# Patient Record
Sex: Female | Born: 1975 | Race: White | Hispanic: No | Marital: Single | Smoking: Light tobacco smoker
Health system: Southern US, Community
[De-identification: ages and names within clinical notes are randomized; demographics above are authoritative.]

## PROBLEM LIST (undated history)

## (undated) ENCOUNTER — Inpatient Hospital Stay (HOSPITAL_COMMUNITY): Payer: Self-pay

## (undated) DIAGNOSIS — I10 Essential (primary) hypertension: Secondary | ICD-10-CM

## (undated) DIAGNOSIS — R51 Headache: Secondary | ICD-10-CM

## (undated) DIAGNOSIS — M51369 Other intervertebral disc degeneration, lumbar region without mention of lumbar back pain or lower extremity pain: Secondary | ICD-10-CM

## (undated) DIAGNOSIS — F419 Anxiety disorder, unspecified: Secondary | ICD-10-CM

## (undated) DIAGNOSIS — D649 Anemia, unspecified: Secondary | ICD-10-CM

## (undated) DIAGNOSIS — M5136 Other intervertebral disc degeneration, lumbar region: Secondary | ICD-10-CM

## (undated) DIAGNOSIS — M2550 Pain in unspecified joint: Secondary | ICD-10-CM

## (undated) DIAGNOSIS — M797 Fibromyalgia: Secondary | ICD-10-CM

## (undated) DIAGNOSIS — M549 Dorsalgia, unspecified: Secondary | ICD-10-CM

## (undated) HISTORY — PX: BACK SURGERY: SHX140

## (undated) HISTORY — PX: DILATION AND CURETTAGE OF UTERUS: SHX78

## (undated) HISTORY — PX: MULTIPLE TOOTH EXTRACTIONS: SHX2053

## (undated) HISTORY — PX: WISDOM TOOTH EXTRACTION: SHX21

---

## 1999-01-02 ENCOUNTER — Emergency Department (HOSPITAL_COMMUNITY): Admission: EM | Admit: 1999-01-02 | Discharge: 1999-01-02 | Payer: Self-pay | Admitting: Emergency Medicine

## 1999-02-08 ENCOUNTER — Ambulatory Visit (HOSPITAL_COMMUNITY): Admission: RE | Admit: 1999-02-08 | Discharge: 1999-02-08 | Payer: Self-pay | Admitting: *Deleted

## 1999-03-19 ENCOUNTER — Inpatient Hospital Stay (HOSPITAL_COMMUNITY): Admission: AD | Admit: 1999-03-19 | Discharge: 1999-03-19 | Payer: Self-pay | Admitting: *Deleted

## 1999-03-31 ENCOUNTER — Inpatient Hospital Stay (HOSPITAL_COMMUNITY): Admission: AD | Admit: 1999-03-31 | Discharge: 1999-03-31 | Payer: Self-pay | Admitting: *Deleted

## 1999-07-09 ENCOUNTER — Inpatient Hospital Stay (HOSPITAL_COMMUNITY): Admission: AD | Admit: 1999-07-09 | Discharge: 1999-07-12 | Payer: Self-pay | Admitting: Obstetrics

## 1999-07-09 ENCOUNTER — Encounter (INDEPENDENT_AMBULATORY_CARE_PROVIDER_SITE_OTHER): Payer: Self-pay

## 1999-09-10 ENCOUNTER — Encounter: Admission: RE | Admit: 1999-09-10 | Discharge: 1999-09-10 | Payer: Self-pay | Admitting: Pediatrics

## 1999-09-26 ENCOUNTER — Encounter: Payer: Self-pay | Admitting: Emergency Medicine

## 1999-09-26 ENCOUNTER — Emergency Department (HOSPITAL_COMMUNITY): Admission: EM | Admit: 1999-09-26 | Discharge: 1999-09-26 | Payer: Self-pay | Admitting: Emergency Medicine

## 2000-03-06 ENCOUNTER — Emergency Department (HOSPITAL_COMMUNITY): Admission: EM | Admit: 2000-03-06 | Discharge: 2000-03-06 | Payer: Self-pay | Admitting: Emergency Medicine

## 2000-03-06 ENCOUNTER — Encounter: Payer: Self-pay | Admitting: Emergency Medicine

## 2001-11-22 ENCOUNTER — Emergency Department (HOSPITAL_COMMUNITY): Admission: EM | Admit: 2001-11-22 | Discharge: 2001-11-22 | Payer: Self-pay | Admitting: Emergency Medicine

## 2001-11-22 ENCOUNTER — Encounter: Payer: Self-pay | Admitting: Emergency Medicine

## 2002-04-27 ENCOUNTER — Emergency Department (HOSPITAL_COMMUNITY): Admission: EM | Admit: 2002-04-27 | Discharge: 2002-04-28 | Payer: Self-pay | Admitting: Emergency Medicine

## 2002-04-27 ENCOUNTER — Encounter: Payer: Self-pay | Admitting: Emergency Medicine

## 2002-08-04 ENCOUNTER — Inpatient Hospital Stay (HOSPITAL_COMMUNITY): Admission: AD | Admit: 2002-08-04 | Discharge: 2002-08-04 | Payer: Self-pay | Admitting: *Deleted

## 2002-09-13 ENCOUNTER — Encounter: Payer: Self-pay | Admitting: *Deleted

## 2002-09-13 ENCOUNTER — Ambulatory Visit (HOSPITAL_COMMUNITY): Admission: RE | Admit: 2002-09-13 | Discharge: 2002-09-13 | Payer: Self-pay | Admitting: *Deleted

## 2003-02-16 ENCOUNTER — Encounter: Admission: RE | Admit: 2003-02-16 | Discharge: 2003-02-16 | Payer: Self-pay | Admitting: *Deleted

## 2003-02-16 ENCOUNTER — Inpatient Hospital Stay (HOSPITAL_COMMUNITY): Admission: AD | Admit: 2003-02-16 | Discharge: 2003-02-16 | Payer: Self-pay | Admitting: *Deleted

## 2003-02-20 ENCOUNTER — Inpatient Hospital Stay (HOSPITAL_COMMUNITY): Admission: AD | Admit: 2003-02-20 | Discharge: 2003-02-24 | Payer: Self-pay | Admitting: *Deleted

## 2003-04-24 ENCOUNTER — Emergency Department (HOSPITAL_COMMUNITY): Admission: AD | Admit: 2003-04-24 | Discharge: 2003-04-24 | Payer: Self-pay | Admitting: Family Medicine

## 2003-04-29 ENCOUNTER — Emergency Department (HOSPITAL_COMMUNITY): Admission: AD | Admit: 2003-04-29 | Discharge: 2003-04-29 | Payer: Self-pay | Admitting: Family Medicine

## 2003-09-18 ENCOUNTER — Emergency Department (HOSPITAL_COMMUNITY): Admission: EM | Admit: 2003-09-18 | Discharge: 2003-09-18 | Payer: Self-pay | Admitting: Family Medicine

## 2005-04-29 ENCOUNTER — Other Ambulatory Visit: Admission: RE | Admit: 2005-04-29 | Discharge: 2005-04-29 | Payer: Self-pay | Admitting: Obstetrics and Gynecology

## 2005-12-08 ENCOUNTER — Inpatient Hospital Stay (HOSPITAL_COMMUNITY): Admission: RE | Admit: 2005-12-08 | Discharge: 2005-12-11 | Payer: Self-pay | Admitting: Obstetrics and Gynecology

## 2006-04-29 ENCOUNTER — Emergency Department (HOSPITAL_COMMUNITY): Admission: EM | Admit: 2006-04-29 | Discharge: 2006-04-29 | Payer: Self-pay | Admitting: Emergency Medicine

## 2006-05-01 ENCOUNTER — Other Ambulatory Visit: Admission: RE | Admit: 2006-05-01 | Discharge: 2006-05-01 | Payer: Self-pay | Admitting: Obstetrics and Gynecology

## 2006-05-04 ENCOUNTER — Emergency Department (HOSPITAL_COMMUNITY): Admission: EM | Admit: 2006-05-04 | Discharge: 2006-05-04 | Payer: Self-pay | Admitting: Emergency Medicine

## 2006-05-27 ENCOUNTER — Emergency Department (HOSPITAL_COMMUNITY): Admission: EM | Admit: 2006-05-27 | Discharge: 2006-05-27 | Payer: Self-pay | Admitting: Emergency Medicine

## 2006-06-24 ENCOUNTER — Encounter: Admission: RE | Admit: 2006-06-24 | Discharge: 2006-06-24 | Payer: Self-pay | Admitting: Orthopaedic Surgery

## 2006-07-19 ENCOUNTER — Emergency Department (HOSPITAL_COMMUNITY): Admission: EM | Admit: 2006-07-19 | Discharge: 2006-07-19 | Payer: Self-pay | Admitting: Emergency Medicine

## 2006-07-24 ENCOUNTER — Encounter
Admission: RE | Admit: 2006-07-24 | Discharge: 2006-10-22 | Payer: Self-pay | Admitting: Physical Medicine & Rehabilitation

## 2006-07-27 ENCOUNTER — Ambulatory Visit: Payer: Self-pay | Admitting: Physical Medicine & Rehabilitation

## 2006-10-01 ENCOUNTER — Emergency Department (HOSPITAL_COMMUNITY): Admission: EM | Admit: 2006-10-01 | Discharge: 2006-10-01 | Payer: Self-pay | Admitting: Emergency Medicine

## 2006-11-29 ENCOUNTER — Emergency Department (HOSPITAL_COMMUNITY): Admission: EM | Admit: 2006-11-29 | Discharge: 2006-11-29 | Payer: Self-pay | Admitting: Emergency Medicine

## 2006-12-01 ENCOUNTER — Emergency Department (HOSPITAL_COMMUNITY): Admission: EM | Admit: 2006-12-01 | Discharge: 2006-12-01 | Payer: Self-pay | Admitting: Emergency Medicine

## 2007-04-23 ENCOUNTER — Emergency Department (HOSPITAL_COMMUNITY): Admission: EM | Admit: 2007-04-23 | Discharge: 2007-04-23 | Payer: Self-pay | Admitting: Family Medicine

## 2008-01-27 ENCOUNTER — Emergency Department (HOSPITAL_COMMUNITY): Admission: EM | Admit: 2008-01-27 | Discharge: 2008-01-27 | Payer: Self-pay | Admitting: Emergency Medicine

## 2008-01-30 ENCOUNTER — Emergency Department (HOSPITAL_COMMUNITY): Admission: EM | Admit: 2008-01-30 | Discharge: 2008-01-30 | Payer: Self-pay | Admitting: Emergency Medicine

## 2009-07-28 ENCOUNTER — Emergency Department (HOSPITAL_COMMUNITY): Admission: EM | Admit: 2009-07-28 | Discharge: 2009-07-28 | Payer: Self-pay | Admitting: Family Medicine

## 2009-10-15 ENCOUNTER — Emergency Department (HOSPITAL_COMMUNITY): Admission: EM | Admit: 2009-10-15 | Discharge: 2009-10-15 | Payer: Self-pay | Admitting: Emergency Medicine

## 2009-11-05 ENCOUNTER — Emergency Department (HOSPITAL_COMMUNITY): Admission: EM | Admit: 2009-11-05 | Discharge: 2009-11-05 | Payer: Self-pay | Admitting: Family Medicine

## 2010-05-28 ENCOUNTER — Emergency Department (HOSPITAL_COMMUNITY)
Admission: EM | Admit: 2010-05-28 | Discharge: 2010-05-28 | Payer: Self-pay | Source: Home / Self Care | Admitting: Emergency Medicine

## 2010-06-21 ENCOUNTER — Emergency Department (HOSPITAL_COMMUNITY)
Admission: EM | Admit: 2010-06-21 | Discharge: 2010-06-21 | Payer: Self-pay | Source: Home / Self Care | Admitting: Family Medicine

## 2010-09-06 ENCOUNTER — Inpatient Hospital Stay (INDEPENDENT_AMBULATORY_CARE_PROVIDER_SITE_OTHER)
Admission: RE | Admit: 2010-09-06 | Discharge: 2010-09-06 | Disposition: A | Discharge status: Home / Self Care | Payer: Medicaid Other | Source: Ambulatory Visit | Attending: Family Medicine | Admitting: Family Medicine

## 2010-09-06 DIAGNOSIS — J309 Allergic rhinitis, unspecified: Secondary | ICD-10-CM

## 2010-09-06 DIAGNOSIS — G44209 Tension-type headache, unspecified, not intractable: Secondary | ICD-10-CM

## 2010-10-11 NOTE — Procedures (Signed)
NAMEJONATHAN, Kimberly Pace             ACCOUNT NO.:  [id number]   MEDICAL RECORD NO.:  [id number]          PATIENT TYPE:  REC   LOCATION:  TPC                          FACILITY:  [facility]   PHYSICIAN:  Erick Colace, M.D.DATE OF BIRTH:  [birth year]   DATE OF PROCEDURE:  DATE OF DISCHARGE:                               OPERATIVE REPORT   REASON FOR VISIT:  Bilateral sacroiliac joint injection under  fluoroscopic guidance.   Informed consent was obtained after describing the risks and benefits of  the procedure to the patient. These including bruising, infection, loss  of bowel or bladder function, temporary or permanent paralysis. She  elected to proceed and has given written consent. The patient was placed  prone on the fluoroscopy table, Betadine prep, sterile drape. A 25-gauge  inch and a half needle was used to incise the skin and subcu tissue with  1% lidocaine x2 mL and then a 25-gauge 3-inch needle was inserted in the  left SI joint.  AP and lateral imaging and Omnipaque 180 x 0.5 mL  demonstrated no intravascular uptake and then 1 mL of a solution  containing 1 mL of 40 mg per mL Depo-Medrol and 2 mL of 2% lidocaine  injected.  The patient tolerated the procedure well and the right SI  joint was injected using the same equipment and same technique.  The  patient tolerated the procedure well. Pre and post injection vitals  stable.  Pre-injection pain level 10/10.  Post injection pain level  2/10.      Erick Colace, M.D.  Electronically Signed     AEK/MEDQ  D:  08/10/2006 15:03:01  T:  08/11/2006 08:17:26  Job:  [id number]   cc:   Sharolyn Douglas, M.D.  Fax: [phone]

## 2010-10-11 NOTE — Group Therapy Note (Signed)
INITIAL VISIT   Consult requested by Dr. Sharolyn Douglas in regards to back pain as well as  thigh pain.   HISTORY:  [age]-year-old female with a 4 year history of back  pain which she states originated from her first pregnancy.  She denies  any falls.  Looking back at her medical records, she actually had a  pregnancy in 2001 as well.  She has had subsequent pregnancy in 2007 and  she had some exacerbation of her pain since that time as well.  She has  had three C-sections with her pregnancies.  No reported complications  with that and looking through her E-chart, there does not appear to be  any surgical complications associated with her C-sections.  She has had  no motor vehicle accidents.  She has had ER visits in December 2007 for  low back pain treated with Lortab and prednisone.  She had another ER  visit in December for her low back lumbosacral pain.  Treated with  Flexeril and Lortab.  Referred to Orthopedics.  Seen in the ED again on  January 2 for low back pain.  Prescribed Flexeril and Vicodin.  Seen by  [facility], Primary MD.  Seen at [facility] June 08, 2006.  At that time she was on Zoloft,  Darvocet and clonazepam.  Had a prednisone Dosepak.  A lumbar spine in  the office demonstrated grade 2 anterolisthesis L5 on S1 with some  movement on flexion/extension.  She was also given Lortab 5/325 b.i.d.  #60.  She underwent an MRI of the lumbar spine June 24, 2006 showing  normal disks at L4-L5 and above.  At L5-S1 there was bilateral pars  intra-articularis defect, anterolisthesis measured 4 mm with  degenerative disk L5-S1 and foraminal narrowing.  She was seen back on  June 30, 2006 by Dr. Noel Gerold and diagnosed with isthmic  spondylolisthesis with foraminal narrowing and referred to Pain  Management for further treatment given that she had not had any  conservative care.  She had another ED visit on July 19, 2006 for  low  back pain.  She was prescribed Lortab #20.  This was approximately  two weeks after the time when she had received a Vicodin prescription  #30 from Dr. Noel Gerold.   Other past medical history unremarkable.  Pain is graded as 10/10,  fairly constant during the day, worse with activity as well as  inactivity.  Improves with heat.  She is able to climb steps sometimes.  She drives, takes care of her children, has some difficulties with  dressing, bathing and toileting.   REVIEW OF SYSTEMS:  Positive for trouble walking, depression, anxiety.  Negative for suicidal thoughts.   SOCIAL HISTORY:  Single with three daughters.  Has a significant other  that lives in a separate residence.  They are together.  She smokes a  half pack a day.  Occasional marijuana use and uses alcohol once or  twice a month.   FAMILY HISTORY:  Positive for alcohol abuse.   EXAMINATION:  Blood pressure 130/77, pulse 109, respirations 18, O2 sat  98% on room air.  She is an obese female in moderate distress shifting  from side to side and difficulty with transitional movements such as  getting on and off the table as well as getting up and down from a  supine position.  She did better after cuing for proper body techniques.   Her pin prick is reduced left  L4 and S1.  Otherwise intact.  Motor  strength is intact.  Hip range of motion, knee and ankle range of motion  is all normal.  Deep tendon reflexes are normal.  Her back has some  tenderness mainly at the PSIS left greater than right side.   IMPRESSION:  Primarily axial low back pain.  History of isthmic  spondylolisthesis with foraminal narrowing.  No clear radicular  component.  Her Pearlean Brownie test is positive PSIS and given onset during  pregnancy I question SI disorder.   PLAN:  1. Check urine drug screen today.  If using marijuana still will need      to retest GBS prior to prescribing any narcotic analgesics.  2. Schedule for SI injection and if this is not  particularly helpful      go on to trans-laminar epidural plus/minus facet injections.  3. Samples of Naprelan as well as Skelaxin given.   Thank you for this interesting consultation.  We will see the patient  back for injections.  May need to initiate some physical therapy.      Erick Colace, M.D.  Electronically Signed     AEK/MedQ  D:  07/27/2006 16:13:16  T:  07/27/2006 22:25:06  Job #:  [id number]   cc:   Fleet Contras, M.D.  Fax: [phone]

## 2010-10-11 NOTE — Discharge Summary (Signed)
NAMEJEM, Kimberly Pace             ACCOUNT NO.:  [id number]   MEDICAL RECORD NO.:  [id number]          PATIENT TYPE:  INP   LOCATION:  9120                          [facility]ACILITY:  [facility]   PHYSICIAN:  James A. Ashley Royalty, M.D.DATE OF BIRTH:  [birth year]   DATE OF ADMISSION:  12/08/2005  DATE OF DISCHARGE:  12/11/2005                                 DISCHARGE SUMMARY   DISCHARGE DIAGNOSES:  1. Intrauterine pregnancy at term, delivered.  2. Previous cesarean section.  3. Smoker.   OPERATIONS AND PROCEDURES:  Repeat low transverse cesarean section.   CONSULTATIONS:  None.   DISCHARGE MEDICATIONS:  Percocet, Motrin 600 mg, clonazepam 0.5 mg b.i.d.  p.r.n. (patient request).   HISTORY AND PHYSICAL:  This is a 29-year gravida 3, para 2 at 39 weeks 2  days gestation.  Prenatal care complicated by previous cesarean section x2  and obesity.  The patient had also has an anxiety disorder for which she  takes BuSpar 1/2 tablet b.i.d., 15 mg.  The patient was admitted for repeat  cesarean section.   For remainder of history and physical please see chart.   HOSPITAL COURSE:  The patient was admitted to [facility].  Admission laboratory studies were drawn.  On 12/08/2005 she was  taken to the operating room and underwent repeat low transverse cesarean  section.  Procedure was performed by Dr. Sylvester Harder and yielded 7 pounds  15 ounces female, Apgars 8 at one minute and 9 at five minutes, sent to  newborn nursery.  The patient's postoperative course was benign.  She was  discharged on the third postoperative day afebrile in satisfactory  condition.   DISPOSITION:  The patient is to return to [facility]  in approximately 6 weeks for follow-up.      James A. Ashley Royalty, M.D.  Electronically Signed     JAM/MEDQ  D:  01/28/2006  T:  01/29/2006  Job:  [id number]

## 2010-10-11 NOTE — Op Note (Signed)
NAME:  Kimberly Pace, Kimberly Pace                       ACCOUNT NO.:  [id number]   MEDICAL RECORD NO.:  [id number]                   PATIENT TYPE:  INP   LOCATION:  9125                                 [facility]ACILITY[facility]   PHYSICIAN:  Georgina Peer, M.D.              DATE OF BIRTH:  [birth year]   DATE OF PROCEDURE:  02/21/2003  DATE OF DISCHARGE:                                 OPERATIVE REPORT   PREOPERATIVE DIAGNOSES:  1. Intrauterine pregnancy 41 5/7 weeks.  2. Previous cesarean section.  3. Early labor.  4. Declined further attempt at vaginal birth after cesarean.   POSTOPERATIVE DIAGNOSES:  1. Intrauterine pregnancy 41 5/7 weeks.  2. Previous cesarean section.  3. Early labor.  4. Declined further attempt at vaginal birth after cesarean.  5. Macrosomia.   OPERATION PERFORMED:  Repeat low transverse cesarean section.   SURGEON:  Georgina Peer, M.D.   ANESTHESIA:  Epidural, Dr. Jean Rosenthal.   ESTIMATED BLOOD LOSS:  800 mL   COMPLICATIONS:  None.   FINDINGS:  10 pound, 13 ounce female born at 6:06 a.m., Apgar 9 & 9, thin  nonparticulate meconium stained fluid, tubes and ovaries normal.   INDICATIONS FOR PROCEDURE:  This is a 35 year old, gravida 2, para 1-0-0-0  with a previous cesarean section presented for induction. The patient at the  time of admission had an unfavorable cervix but did want to try vaginal  birth after cesarean understanding the risks and complications of VBAC . Her  cervix was 1 and long. She had a Cervidil placed approximately midnight on  the 27th and began contracting regularly after that with contractions every  2-3 minutes that were painful. The patient's cervix did not dilate over four  hours and she decided that further attempts at vaginal birth were not what  she desired and she elected to proceed with repeat cesarean section. Fetal  heart tones were reactive, membranes had not ruptured.   DESCRIPTION OF PROCEDURE:  The patient was taken to the  operating room,  fetal heart tones were auscultated and were normal at 140. She was given an  epidural by Dr. Jean Rosenthal. After adequate anesthesia, she was placed in the  dorsal supine left lateral uterine displacement position. Skin was tested  with an Allis. Appropriate time out and patient identification was taken. A  transverse skin incision was made through the previous incision. This was  taken into the peritoneal cavity in the normal Pfannenstiel manner with  minimal scar tissue being encountered. The perineal incision was widened and  a bladder blade placed over the bladder. A transverse bladder flap was  created and taken down off the lower uterine segment. The bladder was placed  over the bladder flap. A transverse uterine incision was made and at 6:06  through an incision widened with bandage scissors and ruptured membranes  which showed thin nonparticulate meconium, a female infant was delivered  with the aid  of fundal pressure. The infant cried spontaneously, mouth and  nose were suctioned and there was no respiratory distress. The cord was  doubly clamped and cut and the infant was handed to the pediatric team in  attendance. Cord blood was taken, Apgar were 9 & 9 and the weight was 10  pounds and 13 ounces. The placenta was removed as well as the membranes and  the incision was found to be without extension. It was closed in layer with  running locking Vicryl with figure-of-eight suture of Vicryl in the mid  portion to control the bleeder. The pelvis was irrigated and all debris was  removed, the incision was hemostatic. Tubes and ovaries were inspected and  found to be normal. The peritoneum and muscles were approximated using 3-0  Vicryl, the fascia was closed using Vicryl suture, the subcutaneous tissue  was closed using plain gut and the skin was closed using a subcuticular  Monocryl. The patient received 1 g of Ancef after the cord was clamped  during the case. Sponge,  needle and instrument counts were correct x2 and  the patient returned to the recovery area in stable condition.                                               Georgina Peer, M.D.    JPN/MEDQ  D:  02/21/2003  T:  02/21/2003  Job:  [id number]

## 2010-10-11 NOTE — Op Note (Signed)
   NAME:  Kimberly Pace, Kimberly Pace                       ACCOUNT NO.:  [id number]   MEDICAL RECORD NO.:  [id number]                   PATIENT TYPE:  OUT   LOCATION:  ULT                                  [facility]   PHYSICIAN:  Conni Elliot, M.D.             DATE OF BIRTH:  1975/12/14   DATE OF PROCEDURE:  DATE OF DISCHARGE:  09/13/2002                                 OPERATIVE REPORT   PREOPERATIVE DIAGNOSIS:  Intrauterine pregnancy at term with breech  presentation.   POSTOPERATIVE DIAGNOSIS:  Intrauterine pregnancy at term with breech  presentation.   PROCEDURE:  Low transverse cesarean section.   SURGEON:  Conni Elliot, M.D.   ASSISTANT:  Tomi Bamberger. Beverely Pace, M.D.   ANESTHESIA:  Spinal.   OPERATIVE FINDINGS:  Female infant in breech presentation.  Cord pH was 7.27.  Apgars were 1, 3 and 5 and 1, 5 and 10 minutes, respectively.  The fetus  weight 3059 grams.   DESCRIPTION OF PROCEDURE:  Under spinal anesthesia, the patient was in the  supine position.  Abdomen was prepped and draped in the sterile fashion.  A  low transverse Pfannenstiel incision was made.  Incision was extended to the  fascia.  Bladder flap created.  The baby was delivered from the breech  presentation without difficulty.  There was no entrapment of the fetal head.  The cord and doubly clamped and cut, and the placenta was delivered  spontaneously.  Uterus, bladder flap anteverted in the usual fashion.  Skin  closed in the usual fashion.  Estimated blood loss was approximately 900 mL.  Needle and instrument count correct.                                               Conni Elliot, M.D.    ASG/MEDQ  D:  09/20/2002  T:  09/21/2002  Job:  [id number]

## 2010-10-11 NOTE — Discharge Summary (Signed)
[facility]  Patient:    Kimberly Pace, Kimberly Pace                    MRN: [id number] Adm. Date:  [id number] Disc. Date: [id number] Attending:  Tammi Sou Dictator:   Raynelle Jan, M.D.                           Discharge Summary  ADMISSION DIAGNOSIS:          Onset of labor.  DISCHARGE DIAGNOSES:          1. Term pregnancy, delivered.                               2. Low transverse cesarean section.                               3. Breech presentation.  HISTORY OF PRESENT ILLNESS:   This is a 35 year old, gravida 1, para 0, at 40 weeks by last menstrual period who began having contractions at 6 a.m. the day prior o admission which was increasing in frequency and intensity.  She has no prior OB  history.  GYN HISTORY:                  None.  PAST MEDICAL HISTORY:         None.  PAST SURGICAL HISTORY:        None.  MEDICATIONS:                  1. Prenatal vitamins.                               2. Tylenol.  ALLERGIES:                    No known drug allergies.  PHYSICAL EXAMINATION:         VITAL SIGNS: Temperature 98.7, blood pressure 113/56, and pulse 86.  Fetal heart tones in the 120s with positive accellerations and no decellerations.  She is contracting every two to five minutes.  CHEST: Clear. HEART: Regular rate and rhythm with no murmur.  ABDOMEN: Gravid, nontender, with positive bowel sounds.  PELVIC: Cervix was 3, 80%, and -1.  Therefore she was admitted due to early labor.  HOSPITAL COURSE:              Shortly after admission the resident was called to the room by nursing secondary to questionable fetal presentation.  An ultrasound was performed and the child was found to be in breech presentation.  Therefore, she went to the operating room for cesarean section performed by Conni Elliot,  M.D. without maternal complications.  At 12:54 p.m. on February 13, she delivered a female infant with Apgars of 1 at  one minute, 3 at five minutes, and 5 at ten minutes.  The infant was immediately intubated and was transferred to the NICU where the child was found to have polycystic kidneys and long hypoplasia.  The infant died shortly after delivery.  The patients postpartum course was uneventful from a physical standpoint and on postpartum day #3 was felt to be stable for discharge.  At the time of discharge, her vital signs were stable, her examination was benign, the  uterus was firm, her incision was clean, dry, and intact and the staples were removed without complications.  Her H&H was 9.6 and 28.3.  DISCHARGE MEDICATIONS:        1. Ibuprofen 800 mg p.o. q.8h. p.r.n. pain.                               2. Tylox one tablet p.o. q.6h. p.r.n. pain.                               3. Ambien 10 mg p.o. q.h.s. p.r.n. sleeplessness.                               4. Iron 325 mg p.o. t.i.d.                               5. Prenatal vitamins one tablet p.o. q.d.                               6. Colace 100 mg p.o. b.i.d.  DISCHARGE ACTIVITY:           She is to continue to have pelvic rest for six weeks and have no heavy lifting for six weeks as well.  WOUND CARE:                   She is to keep her incision clean and dry.  FOLLOW-UP:                    She is to follow up at six weeks at [facility]. At that time, she desires IUD placement for contraception. DD:  07/24/99 TD:  07/24/99 Job: [id number]

## 2010-10-11 NOTE — Op Note (Signed)
[name], Kimberly Pace             ACCOUNT NO.:  [id number]   MEDICAL RECORD NO.:  [id number]          PATIENT TYPE:  INP   LOCATION:  9120                          FACILITY:  [facility]   PHYSICIAN:  James A. Ashley Royalty, M.D.DATE OF BIRTH:  06-25-1975   DATE OF PROCEDURE:  12/08/2005  DATE OF DISCHARGE:                                 OPERATIVE REPORT   PREOPERATIVE DIAGNOSES:  1.  Intrauterine pregnancy at [redacted] weeks gestation.  2.  Previous cesarean section.   POSTOPERATIVE DIAGNOSES:  1.  Intrauterine pregnancy at [redacted] weeks gestation.  2.  Previous cesarean section.   PROCEDURE:  Repeat low transverse cesarean section.   SURGEON:  Rudy Jew. Ashley Royalty, M.D.   ASSISTANT:  Bing Neighbors. Sydnee Cabal, M.D.   ANESTHESIA:  Spinal.   FINDINGS:  7 pound 15 ounce female, Apgars 8 at one minute, 9 at 5 minutes,  sent to newborn nursery.   ESTIMATED BLOOD LOSS:  600 cc.   COMPLICATIONS:  None.   PACKS AND DRAINS:  Foley.   SPONGE, NEEDLE, AND INSTRUMENT COUNTS:  Reported as correct x2.   PROCEDURE:  The patient was taken to the operating room and placed in the  sitting position.  After spinal anesthetic was administered, she was placed  in the dorsal supine position and prepped and draped in the usual manner for  abdominal surgery.  Foley catheter was placed.  A Pfannenstiel incision was  made through the patient's old C-section scar.  The subcutaneous tissues  sharply and bluntly dissected down to the fascia, which was nicked with a  knife and incised transversely with Mayo scissors.  The underlying rectus  muscles were separated from the fascia using sharp and blunt dissection.  The rectus muscles were separated in the midline, exposing the peritoneum,  which was elevated with hemostats and entered atraumatically with Metzenbaum  scissors.  Incision was extended longitudinally.  The uterus was identified,  and a bladder flap created by incising the intrauterine serosa and sharply  and  bluntly dissecting the bladder inferiorly.  It was held in place with a  bladder blade.  The uterus was then entered through a low transverse  incision using sharp and blunt dissection.  The fluid was clear.  It was  delivered from a vertex presentation in an atraumatic manner.  There was a  nuchal cord x1.  The infant was suctioned.  The cord was doubly clamped,  cut, and the infant given immediately to the awaiting pediatrics team.  Placenta and membranes were removed in their entirety.  The uterus was  exteriorized.  The uterus was then closed in two running layers of #1  Vicryl.  The first was a running-locking layer.  The second was a running,  intermittently locking, and imbricating layer.  Hemostasis was noted.  The  uterus, tubes, and ovaries were inspected and found to be otherwise normal.  They were returned to the abdominal cavity.  Copious irrigation was  accomplished.  Hemostasis was noted.  The peritoneum was then closed with 3-  0 Vicryl in a running fashion.  The fascia was closed with 0 Vicryl  in  running fashion.  The skin was closed with staples.   The patient tolerated the procedure extremely well and was returned to the  recovery room in good condition.  At the close of the procedure, the urine  was clear and copious.      James A. Ashley Royalty, M.D.  Electronically Signed     JAM/MEDQ  D:  12/08/2005  T:  12/08/2005  Job:  [id number]

## 2010-10-11 NOTE — Discharge Summary (Signed)
   NAME:  Kimberly Pace, Kimberly Pace                       ACCOUNT NO.:  [id number]   MEDICAL RECORD NO.:  [id number]                   PATIENT TYPE:  INP   LOCATION:  9125                                 [facility]ACILITY[facility]   PHYSICIAN:  Georgina Peer, M.D.              DATE OF BIRTH:  July 30, 1975   DATE OF ADMISSION:  02/20/2003  DATE OF DISCHARGE:  02/24/2003                                 DISCHARGE SUMMARY   ADMISSION DIAGNOSES:  Previous cesarean section 41-1/2 weeks.   DISCHARGE DIAGNOSES:  1. Previous cesarean section 41-1/2 weeks.  2. Unsuccessful trial of labor.  3. Macrosomia.   HISTORY OF PRESENT ILLNESS:  A 35 year old gravida 2, para 1, [facility] February 09, 2003 brought in for induction.  Began contracting.  Cervix was long and  closed.  The patient decided that she did not want to continue with vaginal  attempt.  Underwent a cesarean section at which time a 10 pound 13 ounce  female, Tacey Ruiz, was born at 61:06 on September 28.  She did well postpartum.  Passed gas on the first day.  Was ambulating, eating, and voiding.  Hemoglobin 10.9 from preoperative of 11.8.  She was afebrile.  Incision was  clean and dry with subcuticular sutures and she had no other complaints.  She was on Percocet and Motrin for pain and was discharged in good  condition.  Given a discharge booklet and will follow up in the office in  two weeks.                                               Georgina Peer, M.D.    JPN/MEDQ  D:  02/24/2003  T:  02/24/2003  Job:  [id number]

## 2010-10-11 NOTE — Assessment & Plan Note (Signed)
Ms. Bittinger returns today after last being seen on 08/10/2006 for  bilateral sacroiliac joint injection under fluoroscopic guidance.  She  went from a pre-injection pain level of 10/10 to a post-injection pain  level of 2/10 and this level relief lasted a couple of days.  She is  back to a 10/10 level.   She has had a prescription for Celebrex 200 b.i.d. since last visit as  well as Skelaxin 800 t.i.d.   Non-narcotic treatment was utilized secondary to having methadone  showing up in the urine drug screen without patient having reported  taking methadone and without her having a prescription for this.  In  addition, THC was found in the urine as well as oxazepam.  She was  taking clonazepam.  She also was found to have alprazolam, ie. Xanax,  which was not listed as a medication she had been taking.   Her current pain score is a 10/10.  Her pain diagram indicates low back  pain and bilateral lower extremity pain.   She states that the last injection while helping the first couple of  days, thereafter, she noted some increase in her pain to the point where  she had difficulty sleeping.  She has had no bowel or bladder  disturbance.  No weakness in the lower extremities.  No falls.  She can  walk 5 minutes at a time.  She climbs steps slowly.  She has no problems  with her ADLs, but indicates need for some assistance with her shopping,  meal prep and household duties.  No tingling in the lower extremities.  Depression and anxiety, but negative for suicidal thoughts.   SOCIAL HISTORY:  Single.  Lives alone.  Has 2 children.  Smokes a 1/2  pack a day.   PHYSICAL EXAMINATION:  She has full strength in the lower extremities.  She has pain over the PSIS bilaterally.  Gait is normal.  She has normal  lower extremity reflexes.   IMPRESSION:  Chronic low back and lower extremity pain.  She does have a  grade 2 anterolisthesis at L5 and S1.  She had a good result, although  temporary, with  a sacroiliac injection indicating this may be an  etiology of her pain and she does describe onset of her pain during  pregnancy.  However, as I indicated to the patient, one sacroiliac  injection is associated with 20% false positive improvement and to  increase diagnostic certainty, would need to repeat it with similar  improvement.  She states that she does not want any other injections  either of the sacroiliac joint or other parts of her spine.   PLAN:  Discussed treatment options stating that nonsteroidals plus  muscle relaxants would be treatment course as well as physical therapy.  She agreed to having some physical therapy, but did not want anymore  prescriptions for the Celebrex.  Did discuss the urine drug screen  results with her and cited this as the reason for non-narcotic  management in particular.   I will see her back in approximately 1 month.      Erick Colace, M.D.  Electronically Signed     [facility]  D:  08/31/2006 14:51:32  T:  08/31/2006 15:24:21  Job #:  [id number]   cc:   Sharolyn Douglas, M.D.  Fax: [phone]   Fleet Contras, M.D.  Fax: [phone]

## 2011-03-30 ENCOUNTER — Encounter: Payer: Self-pay | Admitting: *Deleted

## 2011-03-30 ENCOUNTER — Emergency Department (HOSPITAL_COMMUNITY)
Admission: EM | Admit: 2011-03-30 | Discharge: 2011-03-30 | Disposition: A | Discharge status: Home / Self Care | Payer: Self-pay | Attending: Emergency Medicine | Admitting: Emergency Medicine

## 2011-03-30 DIAGNOSIS — M25519 Pain in unspecified shoulder: Secondary | ICD-10-CM | POA: Insufficient documentation

## 2011-03-30 DIAGNOSIS — R6883 Chills (without fever): Secondary | ICD-10-CM | POA: Insufficient documentation

## 2011-03-30 DIAGNOSIS — X58XXXA Exposure to other specified factors, initial encounter: Secondary | ICD-10-CM | POA: Insufficient documentation

## 2011-03-30 DIAGNOSIS — IMO0002 Reserved for concepts with insufficient information to code with codable children: Secondary | ICD-10-CM | POA: Insufficient documentation

## 2011-03-30 MED ORDER — OXYCODONE-ACETAMINOPHEN 5-325 MG PO TABS: 2.0000 | ORAL_TABLET | ORAL | Status: AC | PRN

## 2011-03-30 MED ORDER — KETOROLAC TROMETHAMINE 60 MG/2ML IM SOLN
60.0000 mg | Freq: Once | INTRAMUSCULAR | Status: AC
  Administered 2011-03-30: 60 mg via INTRAMUSCULAR
  Filled 2011-03-30: qty 2
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
## 2011-03-30 NOTE — Progress Notes (Signed)
Orthopedic Tech Progress Note Patient Details:  Kimberly Pace 1976/04/27 [id number]  Other Ortho Devices Ortho Device Location: applied arm sling to (R) UE Ortho Device Interventions: Application   Jennye Moccasin 03/30/2011, 8:10 PM

## 2011-03-30 NOTE — ED Notes (Signed)
Assumed care on pt. Reports pain at right shoulder worse with movement /certain positions.

## 2011-03-30 NOTE — ED Provider Notes (Signed)
History     CSN: [id number] Arrival date & time: 03/30/2011  4:25 PM   First MD Initiated Contact with Patient 03/30/11 1812      Chief Complaint  Patient presents with  . Shoulder Injury    (Consider location/radiation/quality/duration/timing/severity/associated sxs/prior treatment) Patient is a 35 y.o. female presenting with shoulder injury.  Shoulder Injury   patient has chronic right shoulder pain. This demonstrated the mass to prevent that was 1-2 years ago. According to the patient states she has a history of a "torn ligaments." Her shoulder. No recent trauma, but states the pain was worse today. It is diffusely located in the right shoulder. She's had no fever. No chest pain. No dizziness no numbness no tingling. She's been taking naproxen with limited relief.  History reviewed. No pertinent past medical history.  Past Surgical History  Procedure Date  . Back surgery     No family history on file.  History  Substance Use Topics  . Smoking status: Not on file  . Smokeless tobacco: Not on file  . Alcohol Use: No    OB History    Grav Para Term Preterm Abortions TAB SAB Ect Mult Living                  Review of Systems  Constitutional: Positive for chills.  Respiratory: Negative for chest tightness.   All other systems reviewed and are negative.    Allergies  Review of patient's allergies indicates no known allergies.  Home Medications  No current outpatient prescriptions on file.  BP 133/99  Pulse 82  Temp 98.2 F (36.8 C)  Resp 20  SpO2 100%  LMP 03/23/2011  Physical Exam  Musculoskeletal:       No redness. No swelling. Mild diffuse tenderness. Limited range of motion. 2+ peripheral pulses    ED Course  Procedures (including critical care time)  Labs Reviewed - No data to display No results found.   No diagnosis found.    MDM  Pt is seen and examined;  Initial history and physical completed.  Will follow.          Yashas Camilli A.  Patrica Duel, MD 03/30/11 1820

## 2011-08-27 ENCOUNTER — Emergency Department (HOSPITAL_COMMUNITY): Payer: Medicaid Other

## 2011-08-27 ENCOUNTER — Emergency Department (HOSPITAL_COMMUNITY)
Admission: EM | Admit: 2011-08-27 | Discharge: 2011-08-28 | Disposition: A | Discharge status: Home / Self Care | Payer: Medicaid Other | Attending: Emergency Medicine | Admitting: Emergency Medicine

## 2011-08-27 ENCOUNTER — Encounter (HOSPITAL_COMMUNITY): Payer: Self-pay

## 2011-08-27 DIAGNOSIS — R109 Unspecified abdominal pain: Secondary | ICD-10-CM | POA: Insufficient documentation

## 2011-08-27 DIAGNOSIS — R350 Frequency of micturition: Secondary | ICD-10-CM | POA: Insufficient documentation

## 2011-08-27 DIAGNOSIS — N730 Acute parametritis and pelvic cellulitis: Secondary | ICD-10-CM | POA: Insufficient documentation

## 2011-08-27 DIAGNOSIS — Z79899 Other long term (current) drug therapy: Secondary | ICD-10-CM | POA: Insufficient documentation

## 2011-08-27 DIAGNOSIS — N760 Acute vaginitis: Secondary | ICD-10-CM | POA: Insufficient documentation

## 2011-08-27 DIAGNOSIS — R110 Nausea: Secondary | ICD-10-CM | POA: Insufficient documentation

## 2011-08-27 DIAGNOSIS — R3915 Urgency of urination: Secondary | ICD-10-CM | POA: Insufficient documentation

## 2011-08-27 DIAGNOSIS — N949 Unspecified condition associated with female genital organs and menstrual cycle: Secondary | ICD-10-CM | POA: Insufficient documentation

## 2011-08-27 DIAGNOSIS — Z9889 Other specified postprocedural states: Secondary | ICD-10-CM | POA: Insufficient documentation

## 2011-08-27 DIAGNOSIS — R10819 Abdominal tenderness, unspecified site: Secondary | ICD-10-CM | POA: Insufficient documentation

## 2011-08-27 DIAGNOSIS — B9689 Other specified bacterial agents as the cause of diseases classified elsewhere: Secondary | ICD-10-CM | POA: Insufficient documentation

## 2011-08-27 DIAGNOSIS — A499 Bacterial infection, unspecified: Secondary | ICD-10-CM | POA: Insufficient documentation

## 2011-08-27 LAB — WET PREP, GENITAL
Trich, Wet Prep: NONE SEEN
Yeast Wet Prep HPF POC: NONE SEEN

## 2011-08-27 LAB — CBC
HCT: 38.2 % (ref 36.0–46.0)
Hemoglobin: 12.8 g/dL (ref 12.0–15.0)
MCH: 27 pg (ref 26.0–34.0)
MCHC: 33.5 g/dL (ref 30.0–36.0)
MCV: 80.6 fL (ref 78.0–100.0)

## 2011-08-27 LAB — COMPREHENSIVE METABOLIC PANEL
AST: 12 U/L (ref 0–37)
Alkaline Phosphatase: 69 U/L (ref 39–117)
CO2: 25 mEq/L (ref 19–32)
Calcium: 9.7 mg/dL (ref 8.4–10.5)
GFR calc Af Amer: >90 mL/min (ref >90)
GFR calc non Af Amer: >90 mL/min (ref >90)
Glucose, Bld: 93 mg/dL (ref 70–99)
Total Bilirubin: 0.2 mg/dL — ABNORMAL LOW (ref 0.3–1.2)

## 2011-08-27 LAB — URINALYSIS, ROUTINE W REFLEX MICROSCOPIC
Leukocytes, UA: NEGATIVE
pH: 5.5 (ref 5.0–8.0)

## 2011-08-27 LAB — DIFFERENTIAL
Monocytes Absolute: 0.5 10*3/uL (ref 0.1–1.0)
Neutrophils Relative %: 65 % (ref 43–77)

## 2011-08-27 MED ORDER — HYDROCODONE-ACETAMINOPHEN 5-325 MG PO TABS: 2.0000 | ORAL_TABLET | ORAL | Status: AC | PRN

## 2011-08-27 MED ORDER — METRONIDAZOLE 500 MG PO TABS: 500.0000 mg | ORAL_TABLET | Freq: Two times a day (BID) | ORAL | Status: AC

## 2011-08-27 MED ORDER — DEXTROSE 5 % IV SOLN
1.0000 g | Freq: Once | INTRAVENOUS | Status: DC
  Filled 2011-08-27: qty 10

## 2011-08-27 MED ORDER — MORPHINE SULFATE 4 MG/ML IJ SOLN: 4.0000 mg | Freq: Once | INTRAMUSCULAR | Status: DC

## 2011-08-27 MED ORDER — HYDROCODONE-ACETAMINOPHEN 5-325 MG PO TABS
1.0000 | ORAL_TABLET | Freq: Once | ORAL | Status: AC
  Administered 2011-08-27: 1 via ORAL
  Filled 2011-08-27: qty 1

## 2011-08-27 MED ORDER — KETOROLAC TROMETHAMINE 30 MG/ML IJ SOLN
30.0000 mg | Freq: Once | INTRAMUSCULAR | Status: AC
  Administered 2011-08-27: 30 mg via INTRAVENOUS
  Filled 2011-08-27: qty 1

## 2011-08-27 MED ORDER — DOXYCYCLINE HYCLATE 100 MG PO CAPS: 100.0000 mg | ORAL_CAPSULE | Freq: Two times a day (BID) | ORAL | Status: AC

## 2011-08-27 MED ORDER — MORPHINE SULFATE 4 MG/ML IJ SOLN
4.0000 mg | Freq: Once | INTRAMUSCULAR | Status: AC
  Administered 2011-08-27: 4 mg via INTRAVENOUS
  Filled 2011-08-27: qty 1

## 2011-08-27 MED ORDER — CEFTRIAXONE SODIUM 250 MG IJ SOLR
250.0000 mg | Freq: Once | INTRAMUSCULAR | Status: AC
  Administered 2011-08-28: 250 mg via INTRAMUSCULAR
  Filled 2011-08-27: qty 250

## 2011-08-27 MED ORDER — ONDANSETRON HCL 4 MG PO TABS: 4.0000 mg | ORAL_TABLET | Freq: Four times a day (QID) | ORAL | Status: AC

## 2011-08-27 NOTE — ED Notes (Signed)
Pelvic cart set up in pt room 

## 2011-08-27 NOTE — ED Notes (Signed)
The patient states that she is "long overdue to have her IUD removed."

## 2011-08-27 NOTE — ED Notes (Signed)
Patient transported to Ultrasound 

## 2011-08-27 NOTE — Discharge Instructions (Signed)
Bacterial Vaginosis Bacterial vaginosis (BV) is a vaginal infection where the normal balance of bacteria in the vagina is disrupted. The normal balance is then replaced by an overgrowth of certain bacteria. There are several different kinds of bacteria that can cause BV. BV is the most common vaginal infection in women of childbearing age. CAUSES   The cause of BV is not fully understood. BV develops when there is an increase or imbalance of harmful bacteria.   Some activities or behaviors can upset the normal balance of bacteria in the vagina and put women at increased risk including:   Having a new sex partner or multiple sex partners.   Douching.   Using an intrauterine device (IUD) for contraception.   It is not clear what role sexual activity plays in the development of BV. However, women that have never had sexual intercourse are rarely infected with BV.  Women do not get BV from toilet seats, bedding, swimming pools or from touching objects around them.  SYMPTOMS   Grey vaginal discharge.   A fish-like odor with discharge, especially after sexual intercourse.   Itching or burning of the vagina and vulva.   Burning or pain with urination.   Some women have no signs or symptoms at all.  DIAGNOSIS  Your caregiver must examine the vagina for signs of BV. Your caregiver will perform lab tests and look at the sample of vaginal fluid through a microscope. They will look for bacteria and abnormal cells (clue cells), a pH test higher than 4.5, and a positive amine test all associated with BV.  RISKS AND COMPLICATIONS   Pelvic inflammatory disease (PID).   Infections following gynecology surgery.   Developing HIV.   Developing herpes virus.  TREATMENT  Sometimes BV will clear up without treatment. However, all women with symptoms of BV should be treated to avoid complications, especially if gynecology surgery is planned. Female partners generally do not need to be treated. However,  BV may spread between female sex partners so treatment is helpful in preventing a recurrence of BV.   BV may be treated with antibiotics. The antibiotics come in either pill or vaginal cream forms. Either can be used with nonpregnant or pregnant women, but the recommended dosages differ. These antibiotics are not harmful to the baby.   BV can recur after treatment. If this happens, a second round of antibiotics will often be prescribed.   Treatment is important for pregnant women. If not treated, BV can cause a premature delivery, especially for a pregnant woman who had a premature birth in the past. All pregnant women who have symptoms of BV should be checked and treated.   For chronic reoccurrence of BV, treatment with a type of prescribed gel vaginally twice a week is helpful.  HOME CARE INSTRUCTIONS   Finish all medication as directed by your caregiver.   Do not have sex until treatment is completed.   Tell your sexual partner that you have a vaginal infection. They should see their caregiver and be treated if they have problems, such as a mild rash or itching.   Practice safe sex. Use condoms. Only have 1 sex partner.  PREVENTION  Basic prevention steps can help reduce the risk of upsetting the natural balance of bacteria in the vagina and developing BV:  Do not have sexual intercourse (be abstinent).   Do not douche.   Use all of the medicine prescribed for treatment of BV, even if the signs and symptoms go away.  Tell your sex partner if you have BV. That way, they can be treated, if needed, to prevent reoccurrence.  SEEK MEDICAL CARE IF:   Your symptoms are not improving after 3 days of treatment.   You have increased discharge, pain, or fever.  MAKE SURE YOU:   Understand these instructions.   Will watch your condition.   Will get help right away if you are not doing well or get worse.  FOR MORE INFORMATION  Division of STD Prevention (DSTDP), Centers for Disease  Control and Prevention: [employer]Apps[employer] American Social Health Association (ASHA): www.ashastd.org  Document Released: 05/12/2005 Document Revised: 05/01/2011 Document Reviewed: 11/02/2008 [facility] Patient Information 2012 [place], [place].Pelvic Inflammatory Disease Pelvic inflammatory disease (PID) is an infection of some, or all, of the female pelvic organs. PID is caused by germs. HOME CARE  Take your medicine as told. Finish them even if you start to feel better.   Only take medicine as told by your doctor.   Do not have sex (intercourse) until the infection is gone.   Tell your sex partner you have PID. Treatment may be needed.   Keep your follow-up appointments.  GET HELP RIGHT AWAY IF:   You have a fever.   You have an increase in belly (abdominal) pain.   You start to get the chills.   You have pain when you pee (urinate).   You are not better after 72 hours.   Your sex partner tells you he or she has an STD.   You throw up (vomit).   You cannot take your medicines.  MAKE SURE YOU:   Understand these instructions.   Will watch your condition.   Will get help right away if you are not doing well or get worse.  Document Released: 08/08/2008 Document Revised: 05/01/2011 Document Reviewed: 09/11/2009 [facility] Patient Information 2012 [place], [place].

## 2011-08-27 NOTE — ED Notes (Signed)
Discarded Rocephin 1 G for IV administration since MD cancelled order.

## 2011-08-27 NOTE — ED Notes (Signed)
Patient presents with abdominal cramping, consistently, x 2 weeks.  Patient reports she has a [place] IUD and is 5 months overdue to be taken out.  Patient reporting urinary frequency and urgency and flank pain.

## 2011-08-27 NOTE — ED Provider Notes (Signed)
History     CSN: [id number]  Arrival date & time 08/27/11  7425   First MD Initiated Contact with Patient 08/27/11 1938      Chief Complaint  Patient presents with  . Abdominal Cramping    (Consider location/radiation/quality/duration/timing/severity/associated sxs/prior treatment) HPI Pt has had Merina in place for 5.5 years. She states she has had worsening bleeding and abdominal cramping since it was placed. Over the past 2 weeks pain has become much worse and constant. Denies fever, chills, vaginal d/c or current vag bleeding. +urinary frequency and lower abd pain.  History reviewed. No pertinent past medical history.  Past Surgical History  Procedure Date  . Back surgery   . Intrauterine device insertion     No family history on file.  History  Substance Use Topics  . Smoking status: Not on file  . Smokeless tobacco: Not on file  . Alcohol Use: No    OB History    Grav Para Term Preterm Abortions TAB SAB Ect Mult Living                  Review of Systems  Constitutional: Negative for fever and chills.  Respiratory: Negative for shortness of breath.   Cardiovascular: Negative for chest pain.  Gastrointestinal: Positive for nausea and abdominal pain. Negative for vomiting and diarrhea.  Genitourinary: Positive for urgency, frequency, menstrual problem and pelvic pain. Negative for dysuria, flank pain, vaginal bleeding and vaginal discharge.  Neurological: Negative for weakness and headaches.    Allergies  Review of patient's allergies indicates no known allergies.  Home Medications   Current Outpatient Rx  Name Route Sig Dispense Refill  . DOXYCYCLINE HYCLATE 100 MG PO CAPS Oral Take 1 capsule (100 mg total) by mouth 2 (two) times daily. 28 capsule 0  . HYDROCODONE-ACETAMINOPHEN 5-325 MG PO TABS Oral Take 2 tablets by mouth every 4 (four) hours as needed for pain. 6 tablet 0  . METRONIDAZOLE 500 MG PO TABS Oral Take 1 tablet (500 mg total) by mouth 2 (two)  times daily. 14 tablet 0  . ONDANSETRON HCL 4 MG PO TABS Oral Take 1 tablet (4 mg total) by mouth every 6 (six) hours. 12 tablet 0    BP 113/77  Pulse 69  Temp(Src) 97.7 F (36.5 C) (Oral)  Resp 14  Ht 5' (1.524 m)  Wt 170 lb (77.111 kg)  BMI 33.20 kg/m2  SpO2 100%  LMP 08/20/2011  Physical Exam  Nursing note and vitals reviewed. Constitutional: She is oriented to person, place, and time. She appears well-developed and well-nourished. No distress.  HENT:  Head: Normocephalic and atraumatic.  Mouth/Throat: Oropharynx is clear and moist.  Eyes: EOM are normal. Pupils are equal, round, and reactive to light.  Neck: Normal range of motion. Neck supple.  Cardiovascular: Normal rate and regular rhythm.   Pulmonary/Chest: Effort normal and breath sounds normal. No respiratory distress. She has no wheezes. She has no rales.  Abdominal: Soft. Bowel sounds are normal. There is tenderness (Suprapubic TTP, No R/G). There is no rebound and no guarding.  Genitourinary:       Coiled IUD string in vaginal vault. +purulent d/c from cervical os. +CMT.   Musculoskeletal: Normal range of motion. She exhibits no edema and no tenderness.  Neurological: She is alert and oriented to person, place, and time.  Skin: Skin is warm and dry. No rash noted. No erythema.  Psychiatric: She has a normal mood and affect. Her behavior is normal.  ED Course  Procedures (including critical care time)  Labs Reviewed  CBC - Abnormal; Notable for the following:    WBC 10.8 (*)    All other components within normal limits  COMPREHENSIVE METABOLIC PANEL - Abnormal; Notable for the following:    Total Bilirubin 0.2 (*)    All other components within normal limits  WET PREP, GENITAL - Abnormal; Notable for the following:    Clue Cells Wet Prep HPF POC MODERATE (*)    WBC, Wet Prep HPF POC FEW (*)    All other components within normal limits  DIFFERENTIAL  URINALYSIS, ROUTINE W REFLEX MICROSCOPIC  PREGNANCY,  URINE  GC/CHLAMYDIA PROBE AMP, GENITAL   US Transvaginal Non-ob  08/27/2011  *RADIOLOGY REPORT*  Clinical Data: Pelvic pain and cramping.  TRANSABDOMINAL AND TRANSVAGINAL ULTRASOUND OF PELVIS Technique:  Both transabdominal and transvaginal ultrasound examinations of the pelvis were performed. Transabdominal technique was performed for global imaging of the pelvis including uterus, ovaries, adnexal regions, and pelvic cul-de-sac.  Comparison: None.   It was necessary to proceed with endovaginal exam following the transabdominal exam to visualize the ovaries and endometrium.  Findings:  Uterus: Measures 9.6 x 4.1 x 5.6 cm.  No myometrial abnormalities are identified.  Endometrium: Normal in thickness measuring a maximum of 8.6 mm. There is a large C-section defect involving the anterior myometrium with focal widening of the endometrial cavity in this region. There is fluid and some debris in the endometrial cavity.  An IUD is noted in the lower uterine segment and cervix.  Right ovary:  Measures 3.3 x 2.2 x 2.9 cm.  No cysts or masses.  Left ovary: Measures 3.0 x 2.1 x 2.0 cm.  No cysts or masses.  Other findings: No free fluid  IMPRESSION:  1.  Fluid and debris in the endometrial canal. 2.  A C-section defect noted. 3.  The IUD is in the lower uterine segment and cervix. 4.  Normal ovaries.  Original Report Authenticated By: P. Loralie Champagne, M.D.   US Pelvis Complete  08/27/2011  *RADIOLOGY REPORT*  Clinical Data: Pelvic pain and cramping.  TRANSABDOMINAL AND TRANSVAGINAL ULTRASOUND OF PELVIS Technique:  Both transabdominal and transvaginal ultrasound examinations of the pelvis were performed. Transabdominal technique was performed for global imaging of the pelvis including uterus, ovaries, adnexal regions, and pelvic cul-de-sac.  Comparison: None.   It was necessary to proceed with endovaginal exam following the transabdominal exam to visualize the ovaries and endometrium.  Findings:  Uterus: Measures 9.6 x  4.1 x 5.6 cm.  No myometrial abnormalities are identified.  Endometrium: Normal in thickness measuring a maximum of 8.6 mm. There is a large C-section defect involving the anterior myometrium with focal widening of the endometrial cavity in this region. There is fluid and some debris in the endometrial cavity.  An IUD is noted in the lower uterine segment and cervix.  Right ovary:  Measures 3.3 x 2.2 x 2.9 cm.  No cysts or masses.  Left ovary: Measures 3.0 x 2.1 x 2.0 cm.  No cysts or masses.  Other findings: No free fluid  IMPRESSION:  1.  Fluid and debris in the endometrial canal. 2.  A C-section defect noted. 3.  The IUD is in the lower uterine segment and cervix. 4.  Normal ovaries.  Original Report Authenticated By: P. Loralie Champagne, M.D.     1. PID (acute pelvic inflammatory disease)   2. Bacterial vaginosis       MDM  Loren Racer, MD 08/27/11 [phone]

## 2011-08-28 LAB — GC/CHLAMYDIA PROBE AMP, GENITAL: Chlamydia, DNA Probe: NEGATIVE

## 2011-08-28 MED ORDER — LIDOCAINE HCL (PF) 1 % IJ SOLN
INTRAMUSCULAR | Status: AC
  Administered 2011-08-28: 1 mL
  Filled 2011-08-28: qty 5

## 2011-08-28 NOTE — ED Notes (Signed)
Patient is AOx4 and comfortable with her discharge instructions. 

## 2011-09-19 ENCOUNTER — Encounter (HOSPITAL_COMMUNITY): Payer: Self-pay | Admitting: *Deleted

## 2011-09-19 ENCOUNTER — Inpatient Hospital Stay (HOSPITAL_COMMUNITY)
Admission: AD | Admit: 2011-09-19 | Discharge: 2011-09-19 | Disposition: A | Discharge status: Home / Self Care | Payer: Medicaid Other | Source: Ambulatory Visit | Attending: Obstetrics & Gynecology | Admitting: Obstetrics & Gynecology

## 2011-09-19 DIAGNOSIS — R109 Unspecified abdominal pain: Secondary | ICD-10-CM | POA: Insufficient documentation

## 2011-09-19 DIAGNOSIS — Z30432 Encounter for removal of intrauterine contraceptive device: Secondary | ICD-10-CM

## 2011-09-19 DIAGNOSIS — T8339XA Other mechanical complication of intrauterine contraceptive device, initial encounter: Secondary | ICD-10-CM

## 2011-09-19 DIAGNOSIS — R1084 Generalized abdominal pain: Secondary | ICD-10-CM

## 2011-09-19 HISTORY — DX: Pain in unspecified joint: M25.50

## 2011-09-19 LAB — CBC
HCT: 38.8 % (ref 36.0–46.0)
MCV: 81.5 fL (ref 78.0–100.0)
RDW: 15.3 % (ref 11.5–15.5)
WBC: 7.6 10*3/uL (ref 4.0–10.5)

## 2011-09-19 LAB — DIFFERENTIAL
Basophils Absolute: 0.1 10*3/uL (ref 0.0–0.1)
Eosinophils Relative: 1 % (ref 0–5)
Lymphocytes Relative: 30 % (ref 12–46)
Lymphs Abs: 2.3 10*3/uL (ref 0.7–4.0)
Monocytes Absolute: 0.5 10*3/uL (ref 0.1–1.0)

## 2011-09-19 MED ORDER — HYDROMORPHONE HCL PF 1 MG/ML IJ SOLN
1.0000 mg | Freq: Once | INTRAMUSCULAR | Status: AC
  Administered 2011-09-19: 1 mg via INTRAMUSCULAR
  Filled 2011-09-19: qty 1

## 2011-09-19 NOTE — MAU Provider Note (Signed)
History     CSN: [id number]  Arrival date and time: 09/19/11 1518   First Provider Initiated Contact with Patient 09/19/11 1616      Chief Complaint  Patient presents with  . Abdominal Pain   HPI This is a 36 y.o. female who presents for IUD removal.Was seen in other ED for this and treated with 2 weeks of doxycycline  Pain did not improve.  They did not remove IUD.No fever  OB History    Grav Para Term Preterm Abortions TAB SAB Ect Mult Living   3 3 3  0 0 0 0 0 0 2      Past Medical History  Diagnosis Date  . No pertinent past medical history   . Joint pain     Past Surgical History  Procedure Date  . Back surgery   . Intrauterine device insertion   . Cesarean section     Family History  Problem Relation Age of Onset  . Anesthesia problems Neg Hx     History  Substance Use Topics  . Smoking status: Current Everyday Smoker -- 0.5 packs/day for 15 years    Types: Cigarettes  . Smokeless tobacco: Not on file  . Alcohol Use: No    Allergies: No Known Allergies  Prescriptions prior to admission  Medication Sig Dispense Refill  . Aspirin-Acetaminophen 500-325 MG PACK Take 1 Package by mouth daily as needed. pain      . clonazePAM (KLONOPIN) 0.5 MG tablet Take 0.5 mg by mouth at bedtime as needed. anxiety      . gabapentin (NEURONTIN) 100 MG capsule Take 100 mg by mouth 3 (three) times daily.      Marland Kitchen HYDROcodone-acetaminophen (VICODIN) 5-500 MG per tablet Take 1 tablet by mouth every 6 (six) hours as needed. pain        ROS As listed in HPI  Physical Exam   Blood pressure 133/95, pulse 93, temperature 98 F (36.7 C), temperature source Oral, resp. rate 20, height 5\' 1"  (1.549 m), weight 186 lb 12.8 oz (84.732 kg), last menstrual period 09/14/2011, SpO2 100.00%.  Physical Exam  Constitutional: She is oriented to person, place, and time. She appears well-developed and well-nourished.  HENT:  Head: Normocephalic.  Cardiovascular: Normal rate.     Respiratory: Effort normal.  GI: Soft. She exhibits no distension. There is no tenderness. There is no rebound and no guarding.  Genitourinary: Uterus normal. Vaginal discharge found.  Musculoskeletal: Normal range of motion.  Neurological: She is alert and oriented to person, place, and time.  Skin: Skin is warm and dry.  Psychiatric: She has a normal mood and affect.   String of IUD noted to right of cervix.   IUD removed with ring forceps without problems.  CBC:   Results for orders placed during the hospital encounter of 09/19/11 (from the past 24 hour(s))  CBC     Status: Normal   Collection Time   09/19/11  4:47 PM      Component Value Range   WBC 7.6  4.0 - 10.5 (K/uL)   RBC 4.76  3.87 - 5.11 (MIL/uL)   Hemoglobin 12.9  12.0 - 15.0 (g/dL)   HCT 78.2  95.6 - 21.3 (%)   MCV 81.5  78.0 - 100.0 (fL)   MCH 27.1  26.0 - 34.0 (pg)   MCHC 33.2  30.0 - 36.0 (g/dL)   RDW 08.6  57.8 - 46.9 (%)   Platelets 267  150 - 400 (K/uL)  DIFFERENTIAL  Status: Normal   Collection Time   09/19/11  4:47 PM      Component Value Range   Neutrophils Relative 62  43 - 77 (%)   Neutro Abs 4.7  1.7 - 7.7 (K/uL)   Lymphocytes Relative 30  12 - 46 (%)   Lymphs Abs 2.3  0.7 - 4.0 (K/uL)   Monocytes Relative 6  3 - 12 (%)   Monocytes Absolute 0.5  0.1 - 1.0 (K/uL)   Eosinophils Relative 1  0 - 5 (%)   Eosinophils Absolute 0.1  0.0 - 0.7 (K/uL)   Basophils Relative 1  0 - 1 (%)   Basophils Absolute 0.1  0.0 - 0.1 (K/uL)     MAU Course  Procedures  Assessment and Plan  A:  Migrated IUD      S/P treatment of PID P:  Removed IUD      No need for further antibiotics      Followup with Greg Cutter 09/19/2011, 5:13 PM

## 2011-09-19 NOTE — Discharge Instructions (Signed)
Contraception Choices Contraception (birth control) is the use of any methods or devices to prevent pregnancy. Below are some methods to help avoid pregnancy. HORMONAL METHODS   Contraceptive implant. This is a thin, plastic tube containing progesterone hormone. It does not contain estrogen hormone. Your caregiver inserts the tube in the inner part of the upper arm. The tube can remain in place for up to 3 years. After 3 years, the implant must be removed. The implant prevents the ovaries from releasing an egg (ovulation), thickens the cervical mucus which prevents sperm from entering the uterus, and thins the lining of the inside of the uterus.   Progesterone-only injections. These injections are given every 3 months by your caregiver to prevent pregnancy. This synthetic progesterone hormone stops the ovaries from releasing eggs. It also thickens cervical mucus and changes the uterine lining. This makes it harder for sperm to survive in the uterus.   Birth control pills. These pills contain estrogen and progesterone hormone. They work by stopping the egg from forming in the ovary (ovulation). Birth control pills are prescribed by a caregiver.Birth control pills can also be used to treat heavy periods.   Minipill. This type of birth control pill contains only the progesterone hormone. They are taken every day of each month and must be prescribed by your caregiver.   Birth control patch. The patch contains hormones similar to those in birth control pills. It must be changed once a week and is prescribed by a caregiver.   Vaginal ring. The ring contains hormones similar to those in birth control pills. It is left in the vagina for 3 weeks, removed for 1 week, and then a new one is put back in place. The patient must be comfortable inserting and removing the ring from the vagina.A caregiver's prescription is necessary.   Emergency contraception. Emergency contraceptives prevent pregnancy after  unprotected sexual intercourse. This pill can be taken right after sex or up to 5 days after unprotected sex. It is most effective the sooner you take the pills after having sexual intercourse. Emergency contraceptive pills are available without a prescription. Check with your pharmacist. Do not use emergency contraception as your only form of birth control.  BARRIER METHODS   Female condom. This is a thin sheath (latex or rubber) that is worn over the penis during sexual intercourse. It can be used with spermicide to increase effectiveness.   Female condom. This is a soft, loose-fitting sheath that is put into the vagina before sexual intercourse.   Diaphragm. This is a soft, latex, dome-shaped barrier that must be fitted by a caregiver. It is inserted into the vagina, along with a spermicidal jelly. It is inserted before intercourse. The diaphragm should be left in the vagina for 6 to 8 hours after intercourse.   Cervical cap. This is a round, soft, latex or plastic cup that fits over the cervix and must be fitted by a caregiver. The cap can be left in place for up to 48 hours after intercourse.   Sponge. This is a soft, circular piece of polyurethane foam. The sponge has spermicide in it. It is inserted into the vagina after wetting it and before sexual intercourse.   Spermicides. These are chemicals that kill or block sperm from entering the cervix and uterus. They come in the form of creams, jellies, suppositories, foam, or tablets. They do not require a prescription. They are inserted into the vagina with an applicator before having sexual intercourse. The process must be   repeated every time you have sexual intercourse.  INTRAUTERINE CONTRACEPTION  Intrauterine device (IUD). This is a T-shaped device that is put in a woman's uterus during a menstrual period to prevent pregnancy. There are 2 types:   Copper IUD. This type of IUD is wrapped in copper wire and is placed inside the uterus. Copper  makes the uterus and fallopian tubes produce a fluid that kills sperm. It can stay in place for 10 years.   Hormone IUD. This type of IUD contains the hormone progestin (synthetic progesterone). The hormone thickens the cervical mucus and prevents sperm from entering the uterus, and it also thins the uterine lining to prevent implantation of a fertilized egg. The hormone can weaken or kill the sperm that get into the uterus. It can stay in place for 5 years.  PERMANENT METHODS OF CONTRACEPTION  Female tubal ligation. This is when the woman's fallopian tubes are surgically sealed, tied, or blocked to prevent the egg from traveling to the uterus.   Female sterilization. This is when the female has the tubes that carry sperm tied off (vasectomy).This blocks sperm from entering the vagina during sexual intercourse. After the procedure, the man can still ejaculate fluid (semen).  NATURAL PLANNING METHODS  Natural family planning. This is not having sexual intercourse or using a barrier method (condom, diaphragm, cervical cap) on days the woman could become pregnant.   Calendar method. This is keeping track of the length of each menstrual cycle and identifying when you are fertile.   Ovulation method. This is avoiding sexual intercourse during ovulation.   Symptothermal method. This is avoiding sexual intercourse during ovulation, using a thermometer and ovulation symptoms.   Post-ovulation method. This is timing sexual intercourse after you have ovulated.  Regardless of which type or method of contraception you choose, it is important that you use condoms to protect against the transmission of sexually transmitted diseases (STDs). Talk with your caregiver about which form of contraception is most appropriate for you. Document Released: 05/12/2005 Document Revised: 05/01/2011 Document Reviewed: 09/18/2010 [employer] Patient Information 2012 [employer]. 

## 2011-09-19 NOTE — MAU Note (Signed)
Patient was seen at [facility] ED on 4-3 and diagnosed with PID and was treated. States she has had a Mirena IUD for 5 1/2 years and feels her pain is from the IUD. States she has taken all her medication but continues to have pain for the past week.

## 2011-09-19 NOTE — MAU Provider Note (Signed)
Medical Screening exam and patient care preformed by advanced practice provider.  Agree with the above management.  

## 2011-09-19 NOTE — MAU Note (Signed)
Pt in c/o severe pelvic pain that started last night.  Was seen in ED 3 weeks ago, was supposed to have had an appt with [facility] to get it removed, but hasnt had a chance.  States pain has decreased, but picked back up last night.  Last intercourse last night.

## 2011-12-28 ENCOUNTER — Encounter (HOSPITAL_COMMUNITY): Payer: Self-pay | Admitting: Emergency Medicine

## 2011-12-28 ENCOUNTER — Emergency Department (HOSPITAL_COMMUNITY)
Admission: EM | Admit: 2011-12-28 | Discharge: 2011-12-28 | Disposition: A | Discharge status: Home / Self Care | Payer: Medicaid Other | Attending: Emergency Medicine | Admitting: Emergency Medicine

## 2011-12-28 ENCOUNTER — Emergency Department (HOSPITAL_COMMUNITY): Payer: Medicaid Other

## 2011-12-28 DIAGNOSIS — IMO0002 Reserved for concepts with insufficient information to code with codable children: Secondary | ICD-10-CM | POA: Insufficient documentation

## 2011-12-28 DIAGNOSIS — S96819A Strain of other specified muscles and tendons at ankle and foot level, unspecified foot, initial encounter: Secondary | ICD-10-CM | POA: Insufficient documentation

## 2011-12-28 DIAGNOSIS — S93409A Sprain of unspecified ligament of unspecified ankle, initial encounter: Secondary | ICD-10-CM

## 2011-12-28 DIAGNOSIS — S93499A Sprain of other ligament of unspecified ankle, initial encounter: Secondary | ICD-10-CM | POA: Insufficient documentation

## 2011-12-28 MED ORDER — OXYCODONE-ACETAMINOPHEN 5-325 MG PO TABS: 2.0000 | ORAL_TABLET | Freq: Four times a day (QID) | ORAL | Status: AC | PRN

## 2011-12-28 MED ORDER — IBUPROFEN 400 MG PO TABS
400.0000 mg | ORAL_TABLET | Freq: Once | ORAL | Status: AC
  Administered 2011-12-28: 400 mg via ORAL
  Filled 2011-12-28: qty 1

## 2011-12-28 MED ORDER — OXYCODONE-ACETAMINOPHEN 5-325 MG PO TABS
2.0000 | ORAL_TABLET | Freq: Once | ORAL | Status: AC
  Administered 2011-12-28: 2 via ORAL
  Filled 2011-12-28: qty 2

## 2011-12-28 NOTE — ED Provider Notes (Signed)
History    Scribed for Hurman Horn, MD, the patient was seen in room TR09C/TR09C. This chart was scribed by Katha Cabal.   CSN: [id number]  Arrival date & time 12/28/11  1209   First MD Initiated Contact with Patient 12/28/11 1258      Chief Complaint  Patient presents with  . Foot Pain    right foot    (Consider location/radiation/quality/duration/timing/severity/associated sxs/prior treatment) HPI Hurman Horn, MD entered patient's room at 1:02 PM   Kimberly Pace is a 36 y.o. female who presents to the Emergency Department complaining of right foot pain after was jumping in a inflatable bumper jumper yesterday.  Patient fell injuring her right foot. There was no head injury and denies thigh, knee and calf pain.   Nothing taken for pain prior to arrival.  Pain worse while trying to bear weight on foot.        Past Medical History  Diagnosis Date  . No pertinent past medical history   . Joint pain     Past Surgical History  Procedure Date  . Back surgery   . Cesarean section     Family History  Problem Relation Age of Onset  . Anesthesia problems Neg Hx     History  Substance Use Topics  . Smoking status: Current Everyday Smoker -- 0.5 packs/day for 15 years    Types: Cigarettes  . Smokeless tobacco: Not on file  . Alcohol Use: No    OB History    Grav Para Term Preterm Abortions TAB SAB Ect Mult Living   3 3 3  0 0 0 0 0 0 2      Review of Systems 10 Systems reviewed and all are negative for acute change except as noted in the HPI.   Allergies  Review of patient's allergies indicates no known allergies.  Home Medications   Current Outpatient Rx  Name Route Sig Dispense Refill  . CLONAZEPAM 0.5 MG PO TABS Oral Take 0.5 mg by mouth at bedtime as needed. For anxiety    . GABAPENTIN 300 MG PO CAPS Oral Take 300 mg by mouth 3 (three) times daily.    Marland Kitchen NABUMETONE 750 MG PO TABS Oral Take 750 mg by mouth 2 (two) times daily.    .  OXYCODONE-ACETAMINOPHEN 5-325 MG PO TABS Oral Take 2 tablets by mouth every 6 (six) hours as needed for pain. 20 tablet 0    BP 138/105  Pulse 72  Temp 97.2 F (36.2 C) (Oral)  Resp 16  SpO2 98%  LMP 12/01/2011  Physical Exam  Nursing note and vitals reviewed. Constitutional:       Awake, alert, nontoxic appearance.  HENT:  Head: Atraumatic.  Eyes: Right eye exhibits no discharge. Left eye exhibits no discharge.  Neck: Neck supple.  Pulmonary/Chest: Effort normal. She exhibits no tenderness.  Abdominal: Soft. There is no tenderness. There is no rebound.  Musculoskeletal: She exhibits tenderness.       Right thgh, knee and calf non tender , DP pulse intact, CR <2 seconds, good movement and sensation in foot, mid foot non tender, proximal 5th metatarsal is non tender, diffuse ankle tenderness including both milleoli and right achilles, deltoid ligament and lateral ligaments, no swelling or deformity   Neurological:       Mental status and motor strength appears baseline for patient and situation.  Skin: No rash noted.  Psychiatric: She has a normal mood and affect.    ED Course  Procedures (including critical care time)   DIAGNOSTIC STUDIES: Oxygen Saturation is 100% on room air, normal by my interpretation.     COORDINATION OF CARE: 1:08 PM  Physical exam complete.  Will xray right ankle.  1:15 PM  Ibuprofen and Percocet/Roxicet ordered.      LABS / RADIOLOGY:   Labs Reviewed - No data to display No results found.       MDM           MEDICATIONS GIVEN IN THE E.D. Scheduled Meds:    Continuous Infusions:       IMPRESSION: 1. Ankle sprain      NEW MEDICATIONS: New Prescriptions   OXYCODONE-ACETAMINOPHEN (PERCOCET) 5-325 MG PER TABLET    Take 2 tablets by mouth every 6 (six) hours as needed for pain.      I personally performed the services described in this documentation, which was scribed in my presence. The recorded information has  been reviewed and considered.  Patient / Family / Caregiver informed of clinical course, understand medical decision-making process, and agree with plan.       Hurman Horn, MD 01/03/12 [phone]

## 2011-12-28 NOTE — ED Notes (Signed)
AIDET performed. 

## 2011-12-28 NOTE — ED Notes (Signed)
Pt reports injured right foot while jumping at bumper jumpers yesterday. Pt presents with slight swelling to right foot and ankle. Pt reports unable to bear weight on right foot.

## 2011-12-28 NOTE — Progress Notes (Signed)
Orthopedic Tech Progress Note Patient Details:  DANISE DEHNE Apr 26, 1976 [id number]  Ortho Devices Type of Ortho Device: Crutches;ASO Ortho Device/Splint Location: right ankle Ortho Device/Splint Interventions: Application   Kalese Ensz 12/28/2011, 2:55 PM

## 2012-06-05 ENCOUNTER — Encounter (HOSPITAL_COMMUNITY): Payer: Self-pay

## 2012-06-05 ENCOUNTER — Emergency Department (HOSPITAL_COMMUNITY)
Admission: EM | Admit: 2012-06-05 | Discharge: 2012-06-05 | Disposition: A | Discharge status: Home / Self Care | Payer: Medicaid Other | Attending: Emergency Medicine | Admitting: Emergency Medicine

## 2012-06-05 DIAGNOSIS — K029 Dental caries, unspecified: Secondary | ICD-10-CM | POA: Insufficient documentation

## 2012-06-05 DIAGNOSIS — G8929 Other chronic pain: Secondary | ICD-10-CM | POA: Insufficient documentation

## 2012-06-05 DIAGNOSIS — Z9119 Patient's noncompliance with other medical treatment and regimen: Secondary | ICD-10-CM | POA: Insufficient documentation

## 2012-06-05 DIAGNOSIS — N390 Urinary tract infection, site not specified: Secondary | ICD-10-CM | POA: Insufficient documentation

## 2012-06-05 DIAGNOSIS — F17200 Nicotine dependence, unspecified, uncomplicated: Secondary | ICD-10-CM | POA: Insufficient documentation

## 2012-06-05 DIAGNOSIS — R350 Frequency of micturition: Secondary | ICD-10-CM | POA: Insufficient documentation

## 2012-06-05 DIAGNOSIS — IMO0001 Reserved for inherently not codable concepts without codable children: Secondary | ICD-10-CM | POA: Insufficient documentation

## 2012-06-05 DIAGNOSIS — R300 Dysuria: Secondary | ICD-10-CM | POA: Insufficient documentation

## 2012-06-05 DIAGNOSIS — Z91199 Patient's noncompliance with other medical treatment and regimen due to unspecified reason: Secondary | ICD-10-CM | POA: Insufficient documentation

## 2012-06-05 DIAGNOSIS — M797 Fibromyalgia: Secondary | ICD-10-CM

## 2012-06-05 HISTORY — DX: Fibromyalgia: M79.7

## 2012-06-05 LAB — URINALYSIS, ROUTINE W REFLEX MICROSCOPIC
Bilirubin Urine: NEGATIVE
Glucose, UA: NEGATIVE mg/dL
Ketones, ur: NEGATIVE mg/dL
pH: 6 (ref 5.0–8.0)

## 2012-06-05 LAB — URINE MICROSCOPIC-ADD ON

## 2012-06-05 MED ORDER — PENICILLIN V POTASSIUM 500 MG PO TABS: 500.0000 mg | ORAL_TABLET | Freq: Three times a day (TID) | ORAL | Status: DC

## 2012-06-05 MED ORDER — KETOROLAC TROMETHAMINE 60 MG/2ML IM SOLN
60.0000 mg | Freq: Once | INTRAMUSCULAR | Status: AC
  Administered 2012-06-05: 60 mg via INTRAMUSCULAR
  Filled 2012-06-05: qty 2

## 2012-06-05 MED ORDER — HYDROCODONE-ACETAMINOPHEN 5-325 MG PO TABS: 1.0000 | ORAL_TABLET | ORAL | Status: DC | PRN

## 2012-06-05 MED ORDER — HYDROMORPHONE HCL PF 1 MG/ML IJ SOLN
1.0000 mg | Freq: Once | INTRAMUSCULAR | Status: AC
  Administered 2012-06-05: 1 mg via INTRAMUSCULAR
  Filled 2012-06-05: qty 1

## 2012-06-05 NOTE — ED Notes (Signed)
Pt ambulated to RR with steady gait

## 2012-06-05 NOTE — ED Provider Notes (Signed)
History     CSN: [id number]  Arrival date & time 06/05/12  0865   First MD Initiated Contact with Patient 06/05/12 2124      Chief Complaint  Patient presents with  . Dental Pain  . Urinary Tract Infection  . Muscle Pain    (Consider location/radiation/quality/duration/timing/severity/associated sxs/prior treatment) HPI History provided by pt.   Pt presents w/ multiple complaints.  Recently diagnosed w/ fibromyalgia and is non-compliant w/ medications d/t SE profiles.  Has had diffuse myalgias for the past week.  Pain is typical in location but worse than normal.  No relief w/ ibuprofen. Has also had diffuse, chronic upper dental pain and teeth have been chipping.  Has a dentist but no f/u appt scheduled.  Also c/o several days of dysuria and increased urinary frequency.  Denies fever and low back/abd pain is stable.   Past Medical History  Diagnosis Date  . No pertinent past medical history   . Joint pain   . Fibromyalgia     Past Surgical History  Procedure Date  . Back surgery   . Cesarean section     Family History  Problem Relation Age of Onset  . Anesthesia problems Neg Hx     History  Substance Use Topics  . Smoking status: Current Every Day Smoker -- 0.5 packs/day for 15 years    Types: Cigarettes  . Smokeless tobacco: Not on file  . Alcohol Use: No    OB History    Grav Para Term Preterm Abortions TAB SAB Ect Mult Living   3 3 3  0 0 0 0 0 0 2      Review of Systems  Respiratory:       Cough x 3d  All other systems reviewed and are negative.    Allergies  Review of patient's allergies indicates no known allergies.  Home Medications   Current Outpatient Rx  Name  Route  Sig  Dispense  Refill  . IBU PO   Oral   Take 1 tablet by mouth every 6 (six) hours as needed. For pain           BP 143/94  Pulse 58  Temp 98.2 F (36.8 C) (Oral)  Resp 20  SpO2 98%  LMP 06/05/2012  Physical Exam  Nursing note and vitals  reviewed. Constitutional: She is oriented to person, place, and time. She appears well-developed and well-nourished. No distress.       Uncomfortable appearing  HENT:  Head: Normocephalic and atraumatic.       Advanced caries diffusely.  Several missing teeth.  Eyes:       Normal appearance  Neck: Normal range of motion.  Cardiovascular: Normal rate and regular rhythm.   Pulmonary/Chest: Effort normal and breath sounds normal. No respiratory distress.       coughing  Abdominal: Soft. Bowel sounds are normal. She exhibits no distension.       Obese.  Diffuse ttp  Genitourinary:       Can not assess for CVA tenderness because entire low back is tender.  Musculoskeletal: Normal range of motion.       Diffuse extremity and low back soft tissue tenderness  Neurological: She is alert and oriented to person, place, and time.  Skin: Skin is warm and dry. No rash noted.  Psychiatric: She has a normal mood and affect. Her behavior is normal.    ED Course  Procedures (including critical care time)  Labs Reviewed  URINALYSIS, ROUTINE W REFLEX  MICROSCOPIC - Abnormal; Notable for the following:    Leukocytes, UA SMALL (*)     All other components within normal limits  URINE MICROSCOPIC-ADD ON  URINE CULTURE   No results found.   1. Dental caries   2. Fibromyalgia       MDM  36yo F w/ fibromyalgia presents w/ her typical myalgias.   Non-compliant w/ medications prescribed by her PCP and has not yet followed up with the pain specialist she was referred to.  Also c/o diffuse dental pain.  Exam sig for advanced caries. Prescribed penicillin and advised close f/u with her dentist.  Will likely need several extractions.  Also c/o dysuria and increased urinary frequency.  U/A unremarkable; sent for culture.  Pt received 1mg  IM dilaudid and 60mg  IM toradol for pain in ED and prescribed 20 vicodin.  Given contact information for pain management and we discussed importance/benefits of f/u.       Otilio Miu, PA-C 06/05/12 2226

## 2012-06-05 NOTE — ED Notes (Signed)
Pt reports upper mouth pain d/t dental carries, pt reports she has lost most of her upper teeth, pt also reports all over body aches d/t fibromyalgia 4-5 months ago. Pt denies f/u w/a her dr for pain management. Pt also reports burning w/urination x1 week

## 2012-06-05 NOTE — ED Notes (Signed)
Provider at bedside

## 2012-06-05 NOTE — ED Provider Notes (Signed)
Medical screening examination/treatment/procedure(s) were performed by non-physician practitioner and as supervising physician I was immediately available for consultation/collaboration.    Fredi Hurtado L Anders Hohmann, MD 06/05/12 2249 

## 2012-06-07 LAB — URINE CULTURE: Colony Count: 90000

## 2012-06-08 ENCOUNTER — Telehealth (HOSPITAL_COMMUNITY): Payer: Self-pay | Admitting: Emergency Medicine

## 2012-06-08 NOTE — ED Notes (Signed)
Results received from [facility]. (+) URNC -> 90,000 colonies, Klebsiella Pneumoniae.  Pt given Rx for Pcn in ED (also having dental pain)Chart to MD office for review.

## 2012-06-09 ENCOUNTER — Telehealth (HOSPITAL_COMMUNITY): Payer: Self-pay | Admitting: Emergency Medicine

## 2012-06-09 NOTE — ED Notes (Signed)
Chart reviewed by B. Laveda Norman PA "Recommend Macrobid 100 mg po BID x 5 days for UTI.  Cont. PCN for dental caries"  1/15 LVM requesting callback.

## 2012-06-12 ENCOUNTER — Telehealth (HOSPITAL_COMMUNITY): Payer: Self-pay | Admitting: Emergency Medicine

## 2012-06-12 NOTE — ED Notes (Signed)
Unable to contact patient via phone. Sent letter. °

## 2012-07-04 ENCOUNTER — Telehealth (HOSPITAL_COMMUNITY): Payer: Self-pay | Admitting: Emergency Medicine

## 2012-07-04 NOTE — ED Notes (Signed)
No response to letter sent after 30 days. Chart sent to Medical Records. °

## 2013-01-21 ENCOUNTER — Emergency Department (HOSPITAL_COMMUNITY)
Admission: EM | Admit: 2013-01-21 | Discharge: 2013-01-21 | Disposition: A | Discharge status: Home / Self Care | Payer: Medicaid Other | Attending: Emergency Medicine | Admitting: Emergency Medicine

## 2013-01-21 ENCOUNTER — Encounter (HOSPITAL_COMMUNITY): Payer: Self-pay | Admitting: Emergency Medicine

## 2013-01-21 DIAGNOSIS — Z2089 Contact with and (suspected) exposure to other communicable diseases: Secondary | ICD-10-CM

## 2013-01-21 DIAGNOSIS — M2550 Pain in unspecified joint: Secondary | ICD-10-CM | POA: Insufficient documentation

## 2013-01-21 DIAGNOSIS — IMO0001 Reserved for inherently not codable concepts without codable children: Secondary | ICD-10-CM | POA: Insufficient documentation

## 2013-01-21 DIAGNOSIS — Z79899 Other long term (current) drug therapy: Secondary | ICD-10-CM | POA: Insufficient documentation

## 2013-01-21 DIAGNOSIS — F17200 Nicotine dependence, unspecified, uncomplicated: Secondary | ICD-10-CM | POA: Insufficient documentation

## 2013-01-21 DIAGNOSIS — Z20828 Contact with and (suspected) exposure to other viral communicable diseases: Secondary | ICD-10-CM | POA: Insufficient documentation

## 2013-01-21 DIAGNOSIS — R21 Rash and other nonspecific skin eruption: Secondary | ICD-10-CM | POA: Insufficient documentation

## 2013-01-21 MED ORDER — DIPHENHYDRAMINE HCL 25 MG PO TABS: 25.0000 mg | ORAL_TABLET | Freq: Four times a day (QID) | ORAL | Status: DC | PRN

## 2013-01-21 MED ORDER — PERMETHRIN 5 % EX CREA
TOPICAL_CREAM | CUTANEOUS | Status: DC
Start: 2013-01-21 — End: 2013-06-16

## 2013-01-21 MED ORDER — FAMOTIDINE 20 MG PO TABS: 20.0000 mg | ORAL_TABLET | Freq: Two times a day (BID) | ORAL | Status: DC

## 2013-01-21 NOTE — ED Notes (Signed)
Patient complains of itching and intermittent rash on hands and body. 

## 2013-01-21 NOTE — ED Notes (Signed)
Pt here for possible scabies exposure; pt c/o rash in between fingers with itching

## 2013-01-21 NOTE — ED Provider Notes (Signed)
Medical screening examination/treatment/procedure(s) were performed by non-physician practitioner and as supervising physician I was immediately available for consultation/collaboration.   Shanna Cisco, MD 01/21/13 [phone]

## 2013-01-21 NOTE — ED Provider Notes (Signed)
CSN: [id number]     Arrival date & time 01/21/13  0908 History   First MD Initiated Contact with Patient 01/21/13 [phone]     Chief Complaint  Patient presents with  . Rash   (Consider location/radiation/quality/duration/timing/severity/associated sxs/prior Treatment) HPI Comments: Patient is a 37 year old female with no significant past medical history who presents for a rash x2 weeks. Patient states rash has been worsening since onset and is pruritic. Symptoms mildly relieved with topical Benadryl. Patient denies any aggravating factors. Symptoms began shortly after a friend came to visit. Patient was known to have had scabies. Patient denies any fever, chills, pallor, purulent drainage, or myalgias.  Patient is a 37 y.o. female presenting with rash.  Rash   Past Medical History  Diagnosis Date  . No pertinent past medical history   . Joint pain   . Fibromyalgia    Past Surgical History  Procedure Laterality Date  . Back surgery    . Cesarean section     Family History  Problem Relation Age of Onset  . Anesthesia problems Neg Hx    History  Substance Use Topics  . Smoking status: Current Every Day Smoker -- 0.50 packs/day for 15 years    Types: Cigarettes  . Smokeless tobacco: Not on file  . Alcohol Use: No   OB History   Grav Para Term Preterm Abortions TAB SAB Ect Mult Living   3 3 3  0 0 0 0 0 0 2     Review of Systems  Skin: Positive for rash.  All other systems reviewed and are negative.   Allergies  Review of patient's allergies indicates no known allergies.  Home Medications   Current Outpatient Rx  Name  Route  Sig  Dispense  Refill  . oxyCODONE-acetaminophen (PERCOCET) 10-325 MG per tablet   Oral   Take 1 tablet by mouth every 8 (eight) hours as needed for pain.         . diphenhydrAMINE (BENADRYL) 25 MG tablet   Oral   Take 1 tablet (25 mg total) by mouth every 6 (six) hours as needed for itching (Rash).   30 tablet   0   . famotidine (PEPCID)  20 MG tablet   Oral   Take 1 tablet (20 mg total) by mouth 2 (two) times daily.   30 tablet   0   . permethrin (ELIMITE) 5 % cream      Apply to entire body other than face - let sit for 14 hours then wash off, may repeat in 1 week if still having symptoms   60 g   1    BP 132/76  Pulse 85  Temp(Src) 98.1 F (36.7 C)  Resp 18  SpO2 97%  Physical Exam  Nursing note and vitals reviewed. Constitutional: She is oriented to person, place, and time. She appears well-developed and well-nourished. No distress.  HENT:  Head: Normocephalic and atraumatic.  Eyes: Conjunctivae and EOM are normal. No scleral icterus.  Neck: Normal range of motion.  Cardiovascular: Normal rate, regular rhythm and intact distal pulses.   Pulmonary/Chest: Effort normal. No respiratory distress.  Musculoskeletal: Normal range of motion.  Neurological: She is alert and oriented to person, place, and time.  Skin: Skin is warm and dry. Rash noted. She is not diaphoretic. No erythema. No pallor.  Punctate nonerythematous, pruritic papules to b/l hands and in webbed spaces as well as to torso. No superficial scaling, dryness, weeping, or purulent d/c.  Psychiatric: She has a  normal mood and affect. Her behavior is normal.   ED Course  Procedures (including critical care time) Labs Review Labs Reviewed - No data to display  Imaging Review No results found.  MDM   1. Rash   2. Scabies exposure    37 year old female who presents for rash to her hands and torso. Rash developed secondary to exposure to a friend with scabies. Patient well and nontoxic appearing, hemodynamically stable, and afebrile. Rash none concerning for SJS, erythema multiforme major, and erythema multiforme minor. Patient appropriate for discharge with prescription for Elimite. Benadryl and Pepcid recommended for pruritus. Return precautions advised and patient agreeable to plan with no unaddressed concerns.   Antonietta Breach, PA-C 01/21/13  1701

## 2013-06-16 ENCOUNTER — Encounter (HOSPITAL_COMMUNITY): Payer: Self-pay | Admitting: *Deleted

## 2013-06-16 ENCOUNTER — Inpatient Hospital Stay (HOSPITAL_COMMUNITY): Payer: Medicaid Other

## 2013-06-16 ENCOUNTER — Inpatient Hospital Stay (HOSPITAL_COMMUNITY)
Admission: AD | Admit: 2013-06-16 | Discharge: 2013-06-16 | Disposition: A | Discharge status: Home / Self Care | Payer: Medicaid Other | Source: Ambulatory Visit | Attending: Obstetrics & Gynecology | Admitting: Obstetrics & Gynecology

## 2013-06-16 DIAGNOSIS — Z3201 Encounter for pregnancy test, result positive: Secondary | ICD-10-CM | POA: Insufficient documentation

## 2013-06-16 DIAGNOSIS — N912 Amenorrhea, unspecified: Secondary | ICD-10-CM | POA: Insufficient documentation

## 2013-06-16 DIAGNOSIS — O009 Unspecified ectopic pregnancy without intrauterine pregnancy: Secondary | ICD-10-CM

## 2013-06-16 DIAGNOSIS — O00109 Unspecified tubal pregnancy without intrauterine pregnancy: Secondary | ICD-10-CM | POA: Insufficient documentation

## 2013-06-16 DIAGNOSIS — F17200 Nicotine dependence, unspecified, uncomplicated: Secondary | ICD-10-CM | POA: Insufficient documentation

## 2013-06-16 DIAGNOSIS — R109 Unspecified abdominal pain: Secondary | ICD-10-CM | POA: Insufficient documentation

## 2013-06-16 DIAGNOSIS — O0910 Supervision of pregnancy with history of ectopic or molar pregnancy, unspecified trimester: Secondary | ICD-10-CM

## 2013-06-16 LAB — CBC
HCT: 41.8 % (ref 36.0–46.0)
Hemoglobin: 14.6 g/dL (ref 12.0–15.0)
MCH: 29.6 pg (ref 26.0–34.0)
MCHC: 34.9 g/dL (ref 30.0–36.0)
MCV: 84.8 fL (ref 78.0–100.0)
Platelets: 251 10*3/uL (ref 150–400)
RBC: 4.93 MIL/uL (ref 3.87–5.11)
RDW: 13.2 % (ref 11.5–15.5)
WBC: 11.2 10*3/uL — AB (ref 4.0–10.5)

## 2013-06-16 LAB — WET PREP, GENITAL
Clue Cells Wet Prep HPF POC: NONE SEEN
Trich, Wet Prep: NONE SEEN
WBC WET PREP: NONE SEEN
Yeast Wet Prep HPF POC: NONE SEEN

## 2013-06-16 LAB — URINALYSIS, ROUTINE W REFLEX MICROSCOPIC
Bilirubin Urine: NEGATIVE
Glucose, UA: NEGATIVE mg/dL
HGB URINE DIPSTICK: NEGATIVE
Ketones, ur: NEGATIVE mg/dL
LEUKOCYTES UA: NEGATIVE
NITRITE: NEGATIVE
PROTEIN: NEGATIVE mg/dL
SPECIFIC GRAVITY, URINE: 1.025 (ref 1.005–1.030)
UROBILINOGEN UA: 0.2 mg/dL (ref 0.0–1.0)
pH: 5.5 (ref 5.0–8.0)

## 2013-06-16 LAB — COMPREHENSIVE METABOLIC PANEL
ALBUMIN: 4.2 g/dL (ref 3.5–5.2)
ALK PHOS: 68 U/L (ref 39–117)
ALT: 12 U/L (ref 0–35)
AST: 12 U/L (ref 0–37)
BUN: 11 mg/dL (ref 6–23)
CO2: 20 mEq/L (ref 19–32)
Calcium: 9.7 mg/dL (ref 8.4–10.5)
Chloride: 101 mEq/L (ref 96–112)
Creatinine, Ser: 0.5 mg/dL (ref 0.50–1.10)
GFR calc Af Amer: >90 mL/min (ref >90)
GFR calc non Af Amer: >90 mL/min (ref >90)
Glucose, Bld: 100 mg/dL — ABNORMAL HIGH (ref 70–99)
Potassium: 4.1 mEq/L (ref 3.7–5.3)
SODIUM: 137 meq/L (ref 137–147)
Total Bilirubin: 0.4 mg/dL (ref 0.3–1.2)
Total Protein: 8.3 g/dL (ref 6.0–8.3)

## 2013-06-16 LAB — ABO/RH: ABO/RH(D): O POS

## 2013-06-16 LAB — HCG, QUANTITATIVE, PREGNANCY: HCG, BETA CHAIN, QUANT, S: 8454 m[IU]/mL — AB (ref <5)

## 2013-06-16 LAB — POCT PREGNANCY, URINE: PREG TEST UR: POSITIVE — AB

## 2013-06-16 MED ORDER — METHOTREXATE INJECTION FOR WOMEN'S HOSPITAL
50.0000 mg/m2 | Freq: Once | INTRAMUSCULAR | Status: AC
  Administered 2013-06-16: 100 mg via INTRAMUSCULAR
  Filled 2013-06-16: qty 2

## 2013-06-16 MED ORDER — HYDROMORPHONE HCL 2 MG PO TABS
2.0000 mg | ORAL_TABLET | Freq: Once | ORAL | Status: AC
  Administered 2013-06-16: 2 mg via ORAL
  Filled 2013-06-16: qty 1

## 2013-06-16 MED ORDER — ACETAMINOPHEN 325 MG PO TABS
650.0000 mg | ORAL_TABLET | ORAL | Status: AC
  Administered 2013-06-16: 650 mg via ORAL
  Filled 2013-06-16: qty 2

## 2013-06-16 NOTE — Progress Notes (Signed)
2031 Patient c/o of headache rate at 10.  Vita signs checked and recorded.  2120 While discharge instructions being given patient commented, " I will ask to be brought to [facility] as my headache is not relieved.  Dr. Gala Romney notified via phone.  2125 With order for Dilaudid 2mg s PO x1 before discharge.

## 2013-06-16 NOTE — MAU Note (Signed)
Patient states she had a positive home pregnancy test 2 days ago. Has had abdominal cramping and nausea. Denies bleeding or discharge, no sore throat today or cough. Had a slight sore throat last night.

## 2013-06-16 NOTE — Discharge Instructions (Signed)
Ectopic Pregnancy An ectopic pregnancy is when the fertilized egg attaches (implants) outside the uterus. Most ectopic pregnancies occur in the fallopian tube. Rarely do ectopic pregnancies occur on the ovary, intestine, pelvis, or cervix. In an ectopic pregnancy, the fertilized egg does not have the ability to develop into a normal, healthy baby.  A ruptured ectopic pregnancy is one in which the fallopian tube gets torn or bursts and results in internal bleeding. Often there is intense abdominal pain, and sometimes, vaginal bleeding. Having an ectopic pregnancy can be life threatening. If left untreated, this dangerous condition can lead to a blood transfusion, abdominal surgery, or even death. CAUSES  Damage to the fallopian tubes is the suspected cause in most ectopic pregnancies.  RISK FACTORS Depending on your circumstances, the risk of having an ectopic pregnancy will vary. The level of risk can be divided into three categories. High Risk  You have gone through infertility treatment.  You have had a previous ectopic pregnancy.  You have had previous tubal surgery.  You have had previous surgery to have the fallopian tubes tied (tubal ligation).  You have tubal problems or diseases.  You have been exposed to DES. DES is a medicine that was used until 1971 and had effects on babies whose mothers took the medicine.  You become pregnant while using an intrauterine device (IUD) for birth control. Moderate Risk  You have a history of infertility.  You have a history of a sexually transmitted infection (STI).  You have a history of pelvic inflammatory disease (PID).  You have scarring from endometriosis.  You have multiple sexual partners.  You smoke. Low Risk  You have had previous pelvic surgery.  You use vaginal douching.  You became sexually active before 36 years of age. SIGNS AND SYMPTOMS  An ectopic pregnancy should be suspected in anyone who has missed a period and  has abdominal pain or bleeding.  You may experience normal pregnancy symptoms, such as:  Nausea.  Tiredness.  Breast tenderness.  Other symptoms may include:  Pain with intercourse.  Irregular vaginal bleeding or spotting.  Cramping or pain on one side or in the lower abdomen.  Fast heartbeat.  Passing out while having a bowel movement.  Symptoms of a ruptured ectopic pregnancy and internal bleeding may include:  Sudden, severe pain in the abdomen and pelvis.  Dizziness or fainting.  Pain in the shoulder area. DIAGNOSIS  Tests that may be performed include:  A pregnancy test.  An ultrasound test.  Testing the specific level of pregnancy hormone in the bloodstream.  Taking a sample of uterus tissue (dilation and curettage, D&C).  Surgery to perform a visual exam of the inside of the abdomen using a thin, lighted tube with a tiny camera on the end (laparoscope). TREATMENT  An injection of a medicine called methotrexate may be given. This medicine causes the pregnancy tissue to be absorbed. It is given if:  The diagnosis is made early.  The fallopian tube has not ruptured.  You are considered to be a good candidate for the medicine. Usually, pregnancy hormone blood levels are checked after methotrexate treatment. This is to be sure the medicine is effective. It may take 4 6 weeks for the pregnancy to be absorbed (though most pregnancies will be absorbed by 3 weeks). Surgical treatment may be needed. A laparoscope may be used to remove the pregnancy tissue. If severe internal bleeding occurs, a cut (incision) may be made in the lower abdomen (laparotomy), and the  ectopic pregnancy is removed. This stops the bleeding. Part of the fallopian tube, or the whole tube, may be removed as well (salpingectomy). After surgery, pregnancy hormone tests may be done to be sure there is no pregnancy tissue left. You may receive an Rho(D) immune globulin shot if you are Rh negative and  the father is Rh positive, or if you do not know the Rh type of the father. This is to prevent problems with any future pregnancy. SEEK IMMEDIATE MEDICAL CARE IF:  You have any symptoms of an ectopic pregnancy. This is a medical emergency. Document Released: 06/19/2004 Document Revised: 03/02/2013 Document Reviewed: 12/09/2012 [facility] Patient Information 2014 [place], [place].  Methotrexate Treatment for an Ectopic Pregnancy An ectopic pregnancy is when the fertilized egg attaches (implants) outside the uterus. Most ectopic pregnancies occur in the fallopian tube. Rarely do ectopic pregnancies occur on the ovary, intestine, pelvis, or cervix. An ectopic pregnancy does not have the ability to develop into a normal, healthy baby. Having an ectopic pregnancy can be a life-threatening experience. However, if the ectopic pregnancy is found early enough, it can be treated with a medicine. This medicine is called methotrexate. Methotrexate works by stopping the pregnancy from growing. It helps the body absorb the pregnancy tissue over a 2 to 6 week period (though most pregnancies will be absorbed by 3 weeks).  If methotrexate is successful, there is a good chance that the fallopian tube may be saved. Regardless of whether the fallopian tube is saved, a mother who has had an ectopic pregnancy is at a much higher risk of having another ectopic occur in future pregnancies. One serious concern is the potential for the fallopian tube to tear (rupture). If it does, emergency surgery is needed to remove the pregnancy, and methotrexate cannot be used. The ideal patient for methotrexate is a person who is:   Not bleeding internally.  Has no severe or persistent abdominal pain.  Is committed to following through with lab tests and appointments until the ectopic has absorbed.  Is healthy and has normal liver and kidney functions on evaluation. Methotrexate should not be given to women who:  Are  breastfeeding.  Have a normal pregnancy (intrauterine pregnancy).  Have liver, lung, or kidney disease.  Have blood problems.  Are allergic to methotrexate.  Have peptic ulcers.  Have an ectopic pregnancy larger than 1 inches (3.5 cm) or one that has fetal heartbeats. This is a rule that is followed most of the time (relative contraindication). BEFORE THE TREATMENT Before giving the medicine:  Liver tests, kidney tests, and a complete blood test are performed.  Blood tests are performed to measure the pregnancy hormone levels and to determine the mother's blood type.  If the woman is Rh negative, and the father is Rh positive or his Rh type is not known, a RhoGAM shot is given. TREATMENT  There are 2 methods that your caregiver may use to prescribe methotrexate. One method involves a single dose or injection of the medicine. Another method involves a series of doses. This method involves several injections.  AFTER THE TREATMENT Blood tests will be taken for several weeks to check the pregnancy hormone levels. The blood tests are performed until there is no more pregnancy hormone detected in the blood. There is still a risk of the ectopic pregnancy rupturing while using the methotrexate. There are also side effects of methotrexate, which include:   Nausea and vomiting.  Mouth sores.  Diarrhea.  Rash.  Dizziness.  Increased  abdominal pain.  Increased vaginal bleeding or spotting.  Pneumonia.  Failed treatment.  Hair loss. This is rare and reversible. On very rare occasions, the medicine may affect your blood counts, liver, kidney, bone marrow, or hormone levels. If this happens, your caregiver will want to perform further evaluations. Document Released: 05/06/2001 Document Revised: 08/04/2011 Document Reviewed: 01/16/2011 [facility] Patient Information 2014 [place], [place].  Ectopic Pregnancy An ectopic pregnancy is when the fertilized egg attaches (implants) outside  the uterus. Most ectopic pregnancies occur in the fallopian tube. Rarely do ectopic pregnancies occur on the ovary, intestine, pelvis, or cervix. In an ectopic pregnancy, the fertilized egg does not have the ability to develop into a normal, healthy baby.  A ruptured ectopic pregnancy is one in which the fallopian tube gets torn or bursts and results in internal bleeding. Often there is intense abdominal pain, and sometimes, vaginal bleeding. Having an ectopic pregnancy can be life threatening. If left untreated, this dangerous condition can lead to a blood transfusion, abdominal surgery, or even death. CAUSES  Damage to the fallopian tubes is the suspected cause in most ectopic pregnancies.  RISK FACTORS Depending on your circumstances, the risk of having an ectopic pregnancy will vary. The level of risk can be divided into three categories. High Risk  You have gone through infertility treatment.  You have had a previous ectopic pregnancy.  You have had previous tubal surgery.  You have had previous surgery to have the fallopian tubes tied (tubal ligation).  You have tubal problems or diseases.  You have been exposed to DES. DES is a medicine that was used until 1971 and had effects on babies whose mothers took the medicine.  You become pregnant while using an intrauterine device (IUD) for birth control. Moderate Risk  You have a history of infertility.  You have a history of a sexually transmitted infection (STI).  You have a history of pelvic inflammatory disease (PID).  You have scarring from endometriosis.  You have multiple sexual partners.  You smoke. Low Risk  You have had previous pelvic surgery.  You use vaginal douching.  You became sexually active before 36 years of age. SIGNS AND SYMPTOMS  An ectopic pregnancy should be suspected in anyone who has missed a period and has abdominal pain or bleeding.  You may experience normal pregnancy symptoms, such  as:  Nausea.  Tiredness.  Breast tenderness.  Other symptoms may include:  Pain with intercourse.  Irregular vaginal bleeding or spotting.  Cramping or pain on one side or in the lower abdomen.  Fast heartbeat.  Passing out while having a bowel movement.  Symptoms of a ruptured ectopic pregnancy and internal bleeding may include:  Sudden, severe pain in the abdomen and pelvis.  Dizziness or fainting.  Pain in the shoulder area. DIAGNOSIS  Tests that may be performed include:  A pregnancy test.  An ultrasound test.  Testing the specific level of pregnancy hormone in the bloodstream.  Taking a sample of uterus tissue (dilation and curettage, D&C).  Surgery to perform a visual exam of the inside of the abdomen using a thin, lighted tube with a tiny camera on the end (laparoscope). TREATMENT  An injection of a medicine called methotrexate may be given. This medicine causes the pregnancy tissue to be absorbed. It is given if:  The diagnosis is made early.  The fallopian tube has not ruptured.  You are considered to be a good candidate for the medicine. Usually, pregnancy hormone blood levels are  checked after methotrexate treatment. This is to be sure the medicine is effective. It may take 4 6 weeks for the pregnancy to be absorbed (though most pregnancies will be absorbed by 3 weeks). Surgical treatment may be needed. A laparoscope may be used to remove the pregnancy tissue. If severe internal bleeding occurs, a cut (incision) may be made in the lower abdomen (laparotomy), and the ectopic pregnancy is removed. This stops the bleeding. Part of the fallopian tube, or the whole tube, may be removed as well (salpingectomy). After surgery, pregnancy hormone tests may be done to be sure there is no pregnancy tissue left. You may receive an Rho(D) immune globulin shot if you are Rh negative and the father is Rh positive, or if you do not know the Rh type of the father. This is to  prevent problems with any future pregnancy. SEEK IMMEDIATE MEDICAL CARE IF:  You have any symptoms of an ectopic pregnancy. This is a medical emergency. Document Released: 06/19/2004 Document Revised: 03/02/2013 Document Reviewed: 12/09/2012 [facility] Patient Information 2014 [place], [place].  Methotrexate Treatment for an Ectopic Pregnancy An ectopic pregnancy is when the fertilized egg attaches (implants) outside the uterus. Most ectopic pregnancies occur in the fallopian tube. Rarely do ectopic pregnancies occur on the ovary, intestine, pelvis, or cervix. An ectopic pregnancy does not have the ability to develop into a normal, healthy baby. Having an ectopic pregnancy can be a life-threatening experience. However, if the ectopic pregnancy is found early enough, it can be treated with a medicine. This medicine is called methotrexate. Methotrexate works by stopping the pregnancy from growing. It helps the body absorb the pregnancy tissue over a 2 to 6 week period (though most pregnancies will be absorbed by 3 weeks).  If methotrexate is successful, there is a good chance that the fallopian tube may be saved. Regardless of whether the fallopian tube is saved, a mother who has had an ectopic pregnancy is at a much higher risk of having another ectopic occur in future pregnancies. One serious concern is the potential for the fallopian tube to tear (rupture). If it does, emergency surgery is needed to remove the pregnancy, and methotrexate cannot be used. The ideal patient for methotrexate is a person who is:   Not bleeding internally.  Has no severe or persistent abdominal pain.  Is committed to following through with lab tests and appointments until the ectopic has absorbed.  Is healthy and has normal liver and kidney functions on evaluation. Methotrexate should not be given to women who:  Are breastfeeding.  Have a normal pregnancy (intrauterine pregnancy).  Have liver, lung, or kidney  disease.  Have blood problems.  Are allergic to methotrexate.  Have peptic ulcers.  Have an ectopic pregnancy larger than 1 inches (3.5 cm) or one that has fetal heartbeats. This is a rule that is followed most of the time (relative contraindication). BEFORE THE TREATMENT Before giving the medicine:  Liver tests, kidney tests, and a complete blood test are performed.  Blood tests are performed to measure the pregnancy hormone levels and to determine the mother's blood type.  If the woman is Rh negative, and the father is Rh positive or his Rh type is not known, a RhoGAM shot is given. TREATMENT  There are 2 methods that your caregiver may use to prescribe methotrexate. One method involves a single dose or injection of the medicine. Another method involves a series of doses. This method involves several injections.  AFTER THE TREATMENT Blood tests  will be taken for several weeks to check the pregnancy hormone levels. The blood tests are performed until there is no more pregnancy hormone detected in the blood. There is still a risk of the ectopic pregnancy rupturing while using the methotrexate. There are also side effects of methotrexate, which include:   Nausea and vomiting.  Mouth sores.  Diarrhea.  Rash.  Dizziness.  Increased abdominal pain.  Increased vaginal bleeding or spotting.  Pneumonia.  Failed treatment.  Hair loss. This is rare and reversible. On very rare occasions, the medicine may affect your blood counts, liver, kidney, bone marrow, or hormone levels. If this happens, your caregiver will want to perform further evaluations. Document Released: 05/06/2001 Document Revised: 08/04/2011 Document Reviewed: 01/16/2011 [facility] Patient Information 2014 [place], [place].

## 2013-06-16 NOTE — [facility] Provider Note (Signed)
History     CSN: [id number]  Arrival date and time: 06/16/13 1443   First Provider Initiated Contact with Patient 06/16/13 1539      Chief Complaint  Patient presents with  . Possible Pregnancy  . Abdominal Cramping  . Nausea   HPI  Kimberly Pace is a 38 yo [id number] who presents will a positive home pregnancy test two days ago.  Pt reports that she missed her menstrual cycle this month and had associated abdominal cramping, that feel like menstrual cramps, and nausea.  She endorses dysuria with dark appearing urine. She denies vaginal discharge and/or bleeding. She denies n/v or fever/chills.  OB History   Grav Para Term Preterm Abortions TAB SAB Ect Mult Living   4 3 3  0 0 0 0 0 0 2      Past Medical History  Diagnosis Date  . No pertinent past medical history   . Joint pain   . Fibromyalgia     Past Surgical History  Procedure Laterality Date  . Back surgery    . Cesarean section      Family History  Problem Relation Age of Onset  . Anesthesia problems Neg Hx     History  Substance Use Topics  . Smoking status: Current Every Day Smoker -- 0.50 packs/day for 15 years    Types: Cigarettes  . Smokeless tobacco: Not on file  . Alcohol Use: No    Allergies: No Known Allergies  No prescriptions prior to admission    Review of Systems  Constitutional: Negative for fever.  Eyes: Negative for blurred vision.  Respiratory: Negative for cough.   Cardiovascular: Negative for chest pain.  Gastrointestinal: Positive for nausea and abdominal pain. Negative for vomiting, diarrhea and constipation.  Genitourinary: Positive for dysuria. Negative for urgency, frequency and flank pain.  Neurological: Positive for headaches. Negative for dizziness.   Physical Exam   Blood pressure 120/85, pulse 81, temperature 98.7 F (37.1 C), temperature source Oral, resp. rate 16, height 5\' 2"  (1.575 m), weight 88.089 kg (194 lb 3.2 oz), last menstrual period 05/04/2013, SpO2  100.00%.  Physical Exam  Constitutional: She appears well-developed and well-nourished. No distress.  HENT:  Head: Normocephalic.  Cardiovascular: Normal rate and normal heart sounds.   Respiratory: Effort normal and breath sounds normal.  GI: Soft. She exhibits no distension. There is no tenderness. There is no rebound.  Genitourinary:  Speculum exam:  Vagina:  White, creamy discharge noted. No blood.  No erythema or lesions Cervix: No erythema. No lesions noted.  Bimanual exam:  Uterus:  Non tender Cervix: No CMT Adnexa: Non tender, no masses noted Wet prep and GC/Chlamydia obtained Chaperone present for exam  Musculoskeletal: She exhibits no edema.  Neurological: She is alert.  Psychiatric: She has a normal mood and affect. Her behavior is normal.     ULTRASONOGRAPHY:  TECHNIQUE:  Both transabdominal and transvaginal ultrasound examinations were  performed for complete evaluation of the gestation as well as the  maternal uterus, adnexal regions, and pelvic cul-de-sac.  Transvaginal technique was performed to assess early pregnancy.   COMPARISON: None.   FINDINGS:  Intrauterine gestational sac: On ectopic gestational sac is seen  within the C-section scar in the myometrium of the anterior lower  uterine segment. The gestational sac is not located within the  normal endometrial cavity.  Yolk sac: Visualized   Embryo: Visualized   Cardiac Activity: Visualized   Heart Rate: 92 bpm   CRL: 3 mm 5 w 6  d [place] EDC: 02/10/2014   Maternal uterus/adnexae: Both ovaries are normal in appearance. No adnexal mass or free fluid identified.   IMPRESSION:  Living ectopic pregnancy within previous C-section scar, as  described above. No evidence of hemoperitoneum.  Critical Value/emergent results were called by telephone at the time  of interpretation on 06/16/2013 at 5:29 PM to the patient's nurse  midwife Lattie Haw in [facility], who verbally acknowledged these results.  Electronically  Signed   By: Earle Gell M.D.  On: 06/16/2013 17:30   MAU Course  Procedures  MDM  Urine Pregnancy test Urinalysis CBC Ultrasound (complete less than 14 wks and transvaginal) bhCG quantitative  ABO blood type CMP    Assessment and Plan   1. C section scar ectopic     Dr Gala Romney at bedside to discuss results of U/S and plan of care. MTX dose today in [facility]U following labs Appointment with Dr Harriett Rush on Monday  [place], [place] 06/16/2013, 3:58 PM   I have seen this patient and agree with the above PA student's note.  [place], [place] Certified Nurse-Midwife

## 2013-06-17 ENCOUNTER — Encounter (HOSPITAL_COMMUNITY): Payer: Self-pay | Admitting: *Deleted

## 2013-06-17 LAB — GC/CHLAMYDIA PROBE AMP
CT Probe RNA: NEGATIVE
GC Probe RNA: NEGATIVE

## 2013-06-20 ENCOUNTER — Ambulatory Visit (HOSPITAL_COMMUNITY)
Admission: RE | Admit: 2013-06-20 | Payer: Medicaid Other | Source: Ambulatory Visit | Admitting: Obstetrics and Gynecology

## 2013-06-20 ENCOUNTER — Encounter (HOSPITAL_COMMUNITY): Payer: Self-pay | Admitting: Anesthesiology

## 2013-06-20 HISTORY — DX: Anxiety disorder, unspecified: F41.9

## 2013-06-20 HISTORY — DX: Anemia, unspecified: D64.9

## 2013-06-20 HISTORY — DX: Headache: R51

## 2013-06-20 SURGERY — LAPAROSCOPY OPERATIVE
Anesthesia: Choice

## 2013-06-24 NOTE — MAU Provider Note (Signed)
Pt seen and examined.  NO signs of acute abdomen.  US shows C section scar ectopic approx 5 weeks 6 days.  Discussed case with Dr. Kerin Perna who agrees to follow pt in his office.  MTX given today and she has [place] and hcg levels ordered for Monday in Dr. Orland Penman office.   Discussed diagnosis at length with patient and her significant other.  All questions answered.  Kimberly Weir H.

## 2013-07-27 ENCOUNTER — Encounter (HOSPITAL_COMMUNITY): Payer: Self-pay | Admitting: *Deleted

## 2014-03-08 ENCOUNTER — Inpatient Hospital Stay (HOSPITAL_COMMUNITY): Payer: Medicaid Other

## 2014-03-08 ENCOUNTER — Inpatient Hospital Stay (HOSPITAL_COMMUNITY)
Admission: AD | Admit: 2014-03-08 | Discharge: 2014-03-08 | Disposition: A | Discharge status: Home / Self Care | Payer: Medicaid Other | Source: Ambulatory Visit | Attending: Obstetrics & Gynecology | Admitting: Obstetrics & Gynecology

## 2014-03-08 ENCOUNTER — Encounter (HOSPITAL_COMMUNITY): Payer: Self-pay

## 2014-03-08 DIAGNOSIS — F17210 Nicotine dependence, cigarettes, uncomplicated: Secondary | ICD-10-CM | POA: Insufficient documentation

## 2014-03-08 DIAGNOSIS — R109 Unspecified abdominal pain: Secondary | ICD-10-CM

## 2014-03-08 DIAGNOSIS — O26899 Other specified pregnancy related conditions, unspecified trimester: Secondary | ICD-10-CM

## 2014-03-08 DIAGNOSIS — N760 Acute vaginitis: Secondary | ICD-10-CM | POA: Insufficient documentation

## 2014-03-08 DIAGNOSIS — O3680X0 Pregnancy with inconclusive fetal viability, not applicable or unspecified: Secondary | ICD-10-CM

## 2014-03-08 DIAGNOSIS — N949 Unspecified condition associated with female genital organs and menstrual cycle: Secondary | ICD-10-CM | POA: Diagnosis present

## 2014-03-08 DIAGNOSIS — O99331 Smoking (tobacco) complicating pregnancy, first trimester: Secondary | ICD-10-CM | POA: Diagnosis not present

## 2014-03-08 DIAGNOSIS — Z3A01 Less than 8 weeks gestation of pregnancy: Secondary | ICD-10-CM | POA: Insufficient documentation

## 2014-03-08 DIAGNOSIS — M797 Fibromyalgia: Secondary | ICD-10-CM | POA: Insufficient documentation

## 2014-03-08 DIAGNOSIS — O23591 Infection of other part of genital tract in pregnancy, first trimester: Secondary | ICD-10-CM | POA: Insufficient documentation

## 2014-03-08 DIAGNOSIS — F17200 Nicotine dependence, unspecified, uncomplicated: Secondary | ICD-10-CM | POA: Diagnosis present

## 2014-03-08 DIAGNOSIS — E669 Obesity, unspecified: Secondary | ICD-10-CM | POA: Diagnosis present

## 2014-03-08 DIAGNOSIS — O9989 Other specified diseases and conditions complicating pregnancy, childbirth and the puerperium: Secondary | ICD-10-CM

## 2014-03-08 LAB — CBC
HCT: 41 % (ref 36.0–46.0)
Hemoglobin: 14.2 g/dL (ref 12.0–15.0)
MCH: 29.6 pg (ref 26.0–34.0)
MCHC: 34.6 g/dL (ref 30.0–36.0)
MCV: 85.6 fL (ref 78.0–100.0)
Platelets: 252 10*3/uL (ref 150–400)
RBC: 4.79 MIL/uL (ref 3.87–5.11)
RDW: 13.6 % (ref 11.5–15.5)
WBC: 13.1 10*3/uL — ABNORMAL HIGH (ref 4.0–10.5)

## 2014-03-08 LAB — WET PREP, GENITAL
Trich, Wet Prep: NONE SEEN
WBC, Wet Prep HPF POC: NONE SEEN
Yeast Wet Prep HPF POC: NONE SEEN

## 2014-03-08 LAB — URINALYSIS, ROUTINE W REFLEX MICROSCOPIC
Bilirubin Urine: NEGATIVE
Glucose, UA: NEGATIVE mg/dL
HGB URINE DIPSTICK: NEGATIVE
Ketones, ur: NEGATIVE mg/dL
LEUKOCYTES UA: NEGATIVE
Nitrite: NEGATIVE
Protein, ur: NEGATIVE mg/dL
Specific Gravity, Urine: <1.005 — ABNORMAL LOW (ref 1.005–1.030)
UROBILINOGEN UA: 0.2 mg/dL (ref 0.0–1.0)
pH: 6 (ref 5.0–8.0)

## 2014-03-08 LAB — POCT PREGNANCY, URINE: Preg Test, Ur: POSITIVE — AB

## 2014-03-08 LAB — HIV ANTIBODY (ROUTINE TESTING W REFLEX): HIV 1&2 Ab, 4th Generation: NONREACTIVE

## 2014-03-08 LAB — HCG, QUANTITATIVE, PREGNANCY: hCG, Beta Chain, Quant, S: 397 m[IU]/mL — ABNORMAL HIGH (ref <5)

## 2014-03-08 MED ORDER — METRONIDAZOLE 500 MG PO TABS: 500.0000 mg | ORAL_TABLET | Freq: Two times a day (BID) | ORAL | Status: DC

## 2014-03-08 MED ORDER — VALACYCLOVIR HCL 1 G PO TABS: 1000.0000 mg | ORAL_TABLET | Freq: Two times a day (BID) | ORAL | Status: DC

## 2014-03-08 MED ORDER — LIDOCAINE HCL 2 % EX GEL
1.0000 "application " | Freq: Once | CUTANEOUS | Status: AC
  Administered 2014-03-08: 1 via TOPICAL
  Filled 2014-03-08: qty 5

## 2014-03-08 MED ORDER — PROMETHAZINE HCL 25 MG PO TABS
25.0000 mg | ORAL_TABLET | Freq: Once | ORAL | Status: AC
  Administered 2014-03-08: 25 mg via ORAL
  Filled 2014-03-08: qty 1

## 2014-03-08 MED ORDER — OXYCODONE-ACETAMINOPHEN 5-325 MG PO TABS: 2.0000 | ORAL_TABLET | ORAL | Status: DC | PRN

## 2014-03-08 MED ORDER — PROMETHAZINE HCL 25 MG PO TABS: 25.0000 mg | ORAL_TABLET | Freq: Four times a day (QID) | ORAL | Status: DC | PRN

## 2014-03-08 MED ORDER — OXYCODONE-ACETAMINOPHEN 5-325 MG PO TABS
1.0000 | ORAL_TABLET | Freq: Once | ORAL | Status: AC
  Administered 2014-03-08: 1 via ORAL
  Filled 2014-03-08: qty 1

## 2014-03-08 NOTE — Discharge Instructions (Signed)
Abdominal Pain During Pregnancy °Abdominal pain is common in pregnancy. Most of the time, it does not cause harm. There are many causes of abdominal pain. Some causes are more serious than others. Some of the causes of abdominal pain in pregnancy are easily diagnosed. Occasionally, the diagnosis takes time to understand. Other times, the cause is not determined. Abdominal pain can be a sign that something is very wrong with the pregnancy, or the pain may have nothing to do with the pregnancy at all. For this reason, always tell your health care provider if you have any abdominal discomfort. °HOME CARE INSTRUCTIONS  °Monitor your abdominal pain for any changes. The following actions may help to alleviate any discomfort you are experiencing: °· Do not have sexual intercourse or put anything in your vagina until your symptoms go away completely. °· Get plenty of rest until your pain improves. °· Drink clear fluids if you feel nauseous. Avoid solid food as long as you are uncomfortable or nauseous. °· Only take over-the-counter or prescription medicine as directed by your health care provider. °· Keep all follow-up appointments with your health care provider. °SEEK IMMEDIATE MEDICAL CARE IF: °· You are bleeding, leaking fluid, or passing tissue from the vagina. °· You have increasing pain or cramping. °· You have persistent vomiting. °· You have painful or bloody urination. °· You have a fever. °· You notice a decrease in your baby's movements. °· You have extreme weakness or feel faint. °· You have shortness of breath, with or without abdominal pain. °· You develop a severe headache with abdominal pain. °· You have abnormal vaginal discharge with abdominal pain. °· You have persistent diarrhea. °· You have abdominal pain that continues even after rest, or gets worse. °MAKE SURE YOU:  °· Understand these instructions. °· Will watch your condition. °· Will get help right away if you are not doing well or get  worse. °Document Released: 05/12/2005 Document Revised: 03/02/2013 Document Reviewed: 12/09/2012 °Exit[employer]® Patient Information ©2015 [employer]. This information is not intended to replace advice given to you by your health care provider. Make sure you discuss any questions you have with your health care provider. °Ectopic Pregnancy °An ectopic pregnancy is when the fertilized egg attaches (implants) outside the uterus. Most ectopic pregnancies occur in the fallopian tube. Rarely do ectopic pregnancies occur on the ovary, intestine, pelvis, or cervix. In an ectopic pregnancy, the fertilized egg does not have the ability to develop into a normal, healthy baby.  °A ruptured ectopic pregnancy is one in which the fallopian tube gets torn or bursts and results in internal bleeding. Often there is intense abdominal pain, and sometimes, vaginal bleeding. Having an ectopic pregnancy can be life threatening. If left untreated, this dangerous condition can lead to a blood transfusion, abdominal surgery, or even death. °CAUSES  °Damage to the fallopian tubes is the suspected cause in most ectopic pregnancies.  °RISK FACTORS °Depending on your circumstances, the risk of having an ectopic pregnancy will vary. The level of risk can be divided into three categories. °High Risk °· You have gone through infertility treatment. °· You have had a previous ectopic pregnancy. °· You have had previous tubal surgery. °· You have had previous surgery to have the fallopian tubes tied (tubal ligation). °· You have tubal problems or diseases. °· You have been exposed to DES. DES is a medicine that was used until 1971 and had effects on babies whose mothers took the medicine. °· You become pregnant while using   an intrauterine device (IUD) for birth control. Moderate Risk  You have a history of infertility.  You have a history of a sexually transmitted infection (STI).  You have a history of pelvic inflammatory disease (PID).  You  have scarring from endometriosis.  You have multiple sexual partners.  You smoke. Low Risk  You have had previous pelvic surgery.  You use vaginal douching.  You became sexually active before 36 years of age. SIGNS AND SYMPTOMS  An ectopic pregnancy should be suspected in anyone who has missed a period and has abdominal pain or bleeding.  You may experience normal pregnancy symptoms, such as:  Nausea.  Tiredness.  Breast tenderness.  Other symptoms may include:  Pain with intercourse.  Irregular vaginal bleeding or spotting.  Cramping or pain on one side or in the lower abdomen.  Fast heartbeat.  Passing out while having a bowel movement.  Symptoms of a ruptured ectopic pregnancy and internal bleeding may include:  Sudden, severe pain in the abdomen and pelvis.  Dizziness or fainting.  Pain in the shoulder area. DIAGNOSIS  Tests that may be performed include:  A pregnancy test.  An ultrasound test.  Testing the specific level of pregnancy hormone in the bloodstream.  Taking a sample of uterus tissue (dilation and curettage, D&C).  Surgery to perform a visual exam of the inside of the abdomen using a thin, lighted tube with a tiny camera on the end (laparoscope). TREATMENT  An injection of a medicine called methotrexate may be given. This medicine causes the pregnancy tissue to be absorbed. It is given if:  The diagnosis is made early.  The fallopian tube has not ruptured.  You are considered to be a good candidate for the medicine. Usually, pregnancy hormone blood levels are checked after methotrexate treatment. This is to be sure the medicine is effective. It may take 4-6 weeks for the pregnancy to be absorbed (though most pregnancies will be absorbed by 3 weeks). Surgical treatment may be needed. A laparoscope may be used to remove the pregnancy tissue. If severe internal bleeding occurs, a cut (incision) may be made in the lower abdomen (laparotomy),  and the ectopic pregnancy is removed. This stops the bleeding. Part of the fallopian tube, or the whole tube, may be removed as well (salpingectomy). After surgery, pregnancy hormone tests may be done to be sure there is no pregnancy tissue left. You may receive a Rho (D) immune globulin shot if you are Rh negative and the father is Rh positive, or if you do not know the Rh type of the father. This is to prevent problems with any future pregnancy. SEEK IMMEDIATE MEDICAL CARE IF:  You have any symptoms of an ectopic pregnancy. This is a medical emergency. MAKE SURE YOU:  Understand these instructions.  Will watch your condition.  Will get help right away if you are not doing well or get worse. Document Released: 06/19/2004 Document Revised: 09/26/2013 Document Reviewed: 12/09/2012 [facility] Patient Information 2015 [place], [place]. This information is not intended to replace advice given to you by your health care provider. Make sure you discuss any questions you have with your health care provider. Pelvic Rest Pelvic rest is sometimes recommended for women when:   The placenta is partially or completely covering the opening of the cervix (placenta previa).  There is bleeding between the uterine wall and the amniotic sac in the first trimester (subchorionic hemorrhage).  The cervix begins to open without labor starting (incompetent cervix, cervical insufficiency).  The labor is too early (preterm labor). HOME CARE INSTRUCTIONS  Do not have sexual intercourse, stimulation, or an orgasm.  Do not use tampons, douche, or put anything in the vagina.  Do not lift anything over 10 pounds (4.5 kg).  Avoid strenuous activity or straining your pelvic muscles. SEEK MEDICAL CARE IF:  You have any vaginal bleeding during pregnancy. Treat this as a potential emergency.  You have cramping pain felt low in the stomach (stronger than menstrual cramps).  You notice vaginal discharge (watery, mucus,  or bloody).  You have a low, dull backache.  There are regular contractions or uterine tightening. SEEK IMMEDIATE MEDICAL CARE IF: You have vaginal bleeding and have placenta previa.  Document Released: 09/06/2010 Document Revised: 08/04/2011 Document Reviewed: 09/06/2010 [facility] Patient Information 2015 [place], [place]. This information is not intended to replace advice given to you by your health care provider. Make sure you discuss any questions you have with your health care provider.

## 2014-03-08 NOTE — MAU Note (Signed)
Patient states she started having vaginal pain last, thiinks she might have a very bad yeast infection. Denies bleeding, abdominal pain, nausea,vomiting, diarrhea.

## 2014-03-08 NOTE — MAU Provider Note (Signed)
History     CSN: [id number]  Arrival date and time: 03/08/14 1406   First Provider Initiated Contact with Patient 03/08/14 1619      Chief Complaint  Patient presents with  . Vaginal Pain   HPI Comments: CAMORA TREMAIN 38 y.o. [id number] [redacted]w[redacted]d presents to MAU with pelvic pain that started last night. She denies any pelvic bleeding. She thought it was a yeast infection and used Monistat cream which made it worse. She has a history with her last pregnancy of ectopic and had MTX. Pt states she has fibromyalgia and is currently seeking disability.  Blood type O positive  Vaginal Pain Associated symptoms include abdominal pain.      Past Medical History  Diagnosis Date  . Joint pain   . Fibromyalgia   . Anxiety     no meds since pregnancy  . Headache(784.0)     otc med prn  . Anemia     history with pregnancy    Past Surgical History  Procedure Laterality Date  . Back surgery    . Cesarean section      x 3  . Wisdom tooth extraction    . Multiple tooth extractions      upper dneures and lower partial    Family History  Problem Relation Age of Onset  . Anesthesia problems Neg Hx     History  Substance Use Topics  . Smoking status: Current Every Day Smoker -- 0.50 packs/day for 15 years    Types: Cigarettes  . Smokeless tobacco: Never Used  . Alcohol Use: Yes     Comment: socially but none with pregnancy    Allergies: No Known Allergies  Prescriptions prior to admission  Medication Sig Dispense Refill  . ibuprofen (ADVIL,MOTRIN) 200 MG tablet Take 800 mg by mouth every 6 (six) hours as needed for moderate pain.        Review of Systems  Constitutional: Negative.        Pt has pain to point of difficulty walking. Reports pain 11/10  Gastrointestinal: Positive for abdominal pain.  Genitourinary: Positive for vaginal pain.       Vaginal pain  Musculoskeletal: Negative.   Skin: Negative.   Neurological: Negative.   Psychiatric/Behavioral: Negative.     Physical Exam   Blood pressure 161/110, pulse 98, temperature 98.7 F (37.1 C), temperature source Oral, resp. rate 20, height 5\' 1"  (1.549 m), weight 197 lb (89.359 kg), last menstrual period 02/12/2014, SpO2 100.00%, unknown if currently breastfeeding.  Physical Exam  Constitutional: She is oriented to person, place, and time. She appears well-developed and well-nourished. No distress.  HENT:  Head: Normocephalic and atraumatic.  Eyes: Pupils are equal, round, and reactive to light.  GI: Soft. There is tenderness. There is no rebound.  Genitourinary:  Genital: external at vaginal opening multiple breaks in skin/ cultures taken Vaginal: small amount thick white discharge Cervix: unable to visualize due to pain Bimanual: not done due to risk of viral spread   Neurological: She is alert and oriented to person, place, and time.  Skin: Skin is warm.  Psychiatric: She has a normal mood and affect. Her behavior is normal. Judgment and thought content normal.   Results for orders placed during the hospital encounter of 03/08/14 (from the past 24 hour(s))  URINALYSIS, ROUTINE W REFLEX MICROSCOPIC     Status: Abnormal   Collection Time    03/08/14  2:10 PM      Result Value Ref Range  Color, Urine STRAW (*) YELLOW   APPearance CLEAR  CLEAR   Specific Gravity, Urine <1.005 (*) 1.005 - 1.030   pH 6.0  5.0 - 8.0   Glucose, UA NEGATIVE  NEGATIVE mg/dL   Hgb urine dipstick NEGATIVE  NEGATIVE   Bilirubin Urine NEGATIVE  NEGATIVE   Ketones, ur NEGATIVE  NEGATIVE mg/dL   Protein, ur NEGATIVE  NEGATIVE mg/dL   Urobilinogen, UA 0.2  0.0 - 1.0 mg/dL   Nitrite NEGATIVE  NEGATIVE   Leukocytes, UA NEGATIVE  NEGATIVE  POCT PREGNANCY, URINE     Status: Abnormal   Collection Time    03/08/14  3:15 PM      Result Value Ref Range   Preg Test, Ur POSITIVE (*) NEGATIVE  WET PREP, GENITAL     Status: Abnormal   Collection Time    03/08/14  4:40 PM      Result Value Ref Range   Yeast Wet Prep  HPF POC NONE SEEN  NONE SEEN   Trich, Wet Prep NONE SEEN  NONE SEEN   Clue Cells Wet Prep HPF POC MODERATE (*) NONE SEEN   WBC, Wet Prep HPF POC NONE SEEN  NONE SEEN  CBC     Status: Abnormal   Collection Time    03/08/14  5:03 PM      Result Value Ref Range   WBC 13.1 (*) 4.0 - 10.5 K/uL   RBC 4.79  3.87 - 5.11 MIL/uL   Hemoglobin 14.2  12.0 - 15.0 g/dL   HCT 41.0  36.0 - 46.0 %   MCV 85.6  78.0 - 100.0 fL   MCH 29.6  26.0 - 34.0 pg   MCHC 34.6  30.0 - 36.0 g/dL   RDW 13.6  11.5 - 15.5 %   Platelets 252  150 - 400 K/uL  HCG, QUANTITATIVE, PREGNANCY     Status: Abnormal   Collection Time    03/08/14  5:04 PM      Result Value Ref Range   hCG, Beta Chain, Quant, S 397 (*) <5 mIU/mL  . US Ob Comp Less 14 Wks  03/08/2014   CLINICAL DATA:  Patient with history of 1 day of vaginal pain.  EXAM: OBSTETRIC <14 WK [place] AND TRANSVAGINAL OB US  TECHNIQUE: Both transabdominal and transvaginal ultrasound examinations were performed for complete evaluation of the gestation as well as the maternal uterus, adnexal regions, and pelvic cul-de-sac. Transvaginal technique was performed to assess early pregnancy.  COMPARISON:  None.  FINDINGS: Intrauterine gestational sac: Not visualized  Yolk sac:  Not visualized  Embryo:  Not visualized  Maternal uterus/adnexae: Possible left corpus luteum. The right ovary is unremarkable. No free fluid in the pelvis.  IMPRESSION: No definite intrauterine pregnancy identified.  In the setting of positive pregnancy test and no definite intrauterine pregnancy, this reflects a pregnancy of unknown location. Differential considerations include early normal IUP, abnormal IUP, or nonvisualized ectopic pregnancy. Differentiation is achieved with serial beta HCG supplemented by repeat sonography as clinically warranted.   Electronically Signed   By: Lovey Newcomer M.D.   On: 03/08/2014 20:08   US Ob Transvaginal  03/08/2014   CLINICAL DATA:  Patient with history of 1 day of vaginal  pain.  EXAM: OBSTETRIC <14 WK [place] AND TRANSVAGINAL OB US  TECHNIQUE: Both transabdominal and transvaginal ultrasound examinations were performed for complete evaluation of the gestation as well as the maternal uterus, adnexal regions, and pelvic cul-de-sac. Transvaginal technique was performed to assess early  pregnancy.  COMPARISON:  None.  FINDINGS: Intrauterine gestational sac: Not visualized  Yolk sac:  Not visualized  Embryo:  Not visualized  Maternal uterus/adnexae: Possible left corpus luteum. The right ovary is unremarkable. No free fluid in the pelvis.  IMPRESSION: No definite intrauterine pregnancy identified.  In the setting of positive pregnancy test and no definite intrauterine pregnancy, this reflects a pregnancy of unknown location. Differential considerations include early normal IUP, abnormal IUP, or nonvisualized ectopic pregnancy. Differentiation is achieved with serial beta HCG supplemented by repeat sonography as clinically warranted.   Electronically Signed   By: Lovey Newcomer M.D.   On: 03/08/2014 20:08    MAU Course  Procedures  MDM Wet prep, GC, Chlamydia, CBC, UA, U/S, HIV , Quant, HSV Percocet/ phenergan/ lidocaine gel   Assessment and Plan   A: Pregnancy of unknown location Suspected HSV Bacterial Vaginosis  P: Above orders Repeat Quant 48 hours Flagyl 500 mg po BID x 7 days Percocet 5/325 # 10 Phenergan 25 mg # 30 Valtrex 1 Gram BID x 10 days Pelvic Rest Return to MAU with any pain/ bleeding    Georgia Duff 03/08/2014, 8:38 PM

## 2014-03-09 LAB — GC/CHLAMYDIA PROBE AMP
CT Probe RNA: NEGATIVE
GC Probe RNA: NEGATIVE

## 2014-03-10 ENCOUNTER — Inpatient Hospital Stay (HOSPITAL_COMMUNITY)
Admission: AD | Admit: 2014-03-10 | Discharge: 2014-03-10 | Disposition: A | Discharge status: Home / Self Care | Payer: Medicaid Other | Source: Ambulatory Visit | Attending: Family Medicine | Admitting: Family Medicine

## 2014-03-10 DIAGNOSIS — Z3201 Encounter for pregnancy test, result positive: Secondary | ICD-10-CM | POA: Diagnosis not present

## 2014-03-10 DIAGNOSIS — O3680X0 Pregnancy with inconclusive fetal viability, not applicable or unspecified: Secondary | ICD-10-CM

## 2014-03-10 DIAGNOSIS — Z3A01 Less than 8 weeks gestation of pregnancy: Secondary | ICD-10-CM | POA: Insufficient documentation

## 2014-03-10 DIAGNOSIS — Z331 Pregnant state, incidental: Secondary | ICD-10-CM

## 2014-03-10 LAB — HERPES SIMPLEX VIRUS CULTURE
Culture: NOT DETECTED
SPECIAL REQUESTS: NORMAL

## 2014-03-10 LAB — HCG, QUANTITATIVE, PREGNANCY: HCG, BETA CHAIN, QUANT, S: 878 m[IU]/mL — AB (ref <5)

## 2014-03-10 NOTE — MAU Provider Note (Signed)
Attestation of Attending Supervision of Advanced Practitioner (PA/CNM/NP): Evaluation and management procedures were performed by the Advanced Practitioner under my supervision and collaboration.  I have reviewed the Advanced Practitioner's note and chart, and I agree with the management and plan.  Gor Vestal, MD, FACOG Attending Obstetrician & Gynecologist Faculty Practice, [facility]   

## 2014-03-10 NOTE — MAU Note (Signed)
Marcille Buffy CNM in triage discussing test results and d/c plan with pt. Pt d/c home from Triage.

## 2014-03-10 NOTE — MAU Provider Note (Signed)
  History     CSN: [id number]  Arrival date and time: 03/10/14 2117   None     Chief Complaint  Patient presents with  . Follow-up   HPI Kimberly Pace is a 38 y.o. [id number] at [redacted]w[redacted]d who presents today for repeat HCG. She denies any pain or bleeding today.   Past Medical History  Diagnosis Date  . Joint pain   . Fibromyalgia   . Anxiety     no meds since pregnancy  . Headache(784.0)     otc med prn  . Anemia     history with pregnancy    Past Surgical History  Procedure Laterality Date  . Back surgery    . Cesarean section      x 3  . Wisdom tooth extraction    . Multiple tooth extractions      upper dneures and lower partial    Family History  Problem Relation Age of Onset  . Anesthesia problems Neg Hx     History  Substance Use Topics  . Smoking status: Current Every Day Smoker -- 0.50 packs/day for 15 years    Types: Cigarettes  . Smokeless tobacco: Never Used  . Alcohol Use: Yes     Comment: socially but none with pregnancy    Allergies: No Known Allergies  Prescriptions prior to admission  Medication Sig Dispense Refill  . ibuprofen (ADVIL,MOTRIN) 200 MG tablet Take 800 mg by mouth every 6 (six) hours as needed for moderate pain.      . metroNIDAZOLE (FLAGYL) 500 MG tablet Take 1 tablet (500 mg total) by mouth 2 (two) times daily.  14 tablet  0  . oxyCODONE-acetaminophen (PERCOCET/ROXICET) 5-325 MG per tablet Take 2 tablets by mouth every 4 (four) hours as needed for moderate pain or severe pain.  10 tablet  0  . promethazine (PHENERGAN) 25 MG tablet Take 1 tablet (25 mg total) by mouth every 6 (six) hours as needed for nausea or vomiting.  30 tablet  0  . valACYclovir (VALTREX) 1000 MG tablet Take 1 tablet (1,000 mg total) by mouth 2 (two) times daily.  20 tablet  0    ROS Physical Exam   Blood pressure 120/83, pulse 78, temperature 98.9 F (37.2 C), resp. rate 20, last menstrual period 02/12/2014, unknown if currently  breastfeeding.  Physical Exam  Nursing note and vitals reviewed. Constitutional: She is oriented to person, place, and time. She appears well-developed and well-nourished. No distress.  Cardiovascular: Normal rate.   Respiratory: Effort normal.  GI: Soft. There is no tenderness.  Neurological: She is alert and oriented to person, place, and time.  Skin: Skin is warm and dry.  Psychiatric: She has a normal mood and affect.    MAU Course  Procedures  Results for Kimberly Pace, Kimberly Pace (MRN [id number]) as of 03/10/2014 22:14  Ref. Range 03/08/2014 17:04 03/08/2014 19:57 03/10/2014 21:28  hCG, Beta Chain, Quant, S Latest Range: <5 mIU/mL 397 (H)  878 (H)    Assessment and Plan   1. Pregnancy of unknown anatomic location    Ectopic precautions  Return to MAU as needed FU [place] in 1 week  Mathis Bud 03/10/2014, 10:15 PM

## 2014-03-10 NOTE — MAU Note (Signed)
Here for repeat BHCG. Denies any pain and no bleeding.

## 2014-03-10 NOTE — Discharge Instructions (Signed)
°Ectopic Pregnancy °An ectopic pregnancy is when the fertilized egg attaches (implants) outside the uterus. Most ectopic pregnancies occur in the fallopian tube. Rarely do ectopic pregnancies occur on the ovary, intestine, pelvis, or cervix. In an ectopic pregnancy, the fertilized egg does not have the ability to develop into a normal, healthy baby.  °A ruptured ectopic pregnancy is one in which the fallopian tube gets torn or bursts and results in internal bleeding. Often there is intense abdominal pain, and sometimes, vaginal bleeding. Having an ectopic pregnancy can be life threatening. If left untreated, this dangerous condition can lead to a blood transfusion, abdominal surgery, or even death. °CAUSES  °Damage to the fallopian tubes is the suspected cause in most ectopic pregnancies.  °RISK FACTORS °Depending on your circumstances, the risk of having an ectopic pregnancy will vary. The level of risk can be divided into three categories. °High Risk °· You have gone through infertility treatment. °· You have had a previous ectopic pregnancy. °· You have had previous tubal surgery. °· You have had previous surgery to have the fallopian tubes tied (tubal ligation). °· You have tubal problems or diseases. °· You have been exposed to DES. DES is a medicine that was used until 1971 and had effects on babies whose mothers took the medicine. °· You become pregnant while using an intrauterine device (IUD) for birth control.  °Moderate Risk °· You have a history of infertility. °· You have a history of a sexually transmitted infection (STI). °· You have a history of pelvic inflammatory disease (PID). °· You have scarring from endometriosis. °· You have multiple sexual partners. °· You smoke.  °Low Risk °· You have had previous pelvic surgery. °· You use vaginal douching. °· You became sexually active before 36 years of age. °SIGNS AND SYMPTOMS  °An ectopic pregnancy should be suspected in anyone who has missed a period  and has abdominal pain or bleeding. °· You may experience normal pregnancy symptoms, such as: °¨ Nausea. °¨ Tiredness. °¨ Breast tenderness. °· Other symptoms may include: °¨ Pain with intercourse. °¨ Irregular vaginal bleeding or spotting. °¨ Cramping or pain on one side or in the lower abdomen. °¨ Fast heartbeat. °¨ Passing out while having a bowel movement. °· Symptoms of a ruptured ectopic pregnancy and internal bleeding may include: °¨ Sudden, severe pain in the abdomen and pelvis. °¨ Dizziness or fainting. °¨ Pain in the shoulder area. °DIAGNOSIS  °Tests that may be performed include: °· A pregnancy test. °· An ultrasound test. °· Testing the specific level of pregnancy hormone in the bloodstream. °· Taking a sample of uterus tissue (dilation and curettage, D&C). °· Surgery to perform a visual exam of the inside of the abdomen using a thin, lighted tube with a tiny camera on the end (laparoscope). °TREATMENT  °An injection of a medicine called methotrexate may be given. This medicine causes the pregnancy tissue to be absorbed. It is given if: °· The diagnosis is made early. °· The fallopian tube has not ruptured. °· You are considered to be a good candidate for the medicine. °Usually, pregnancy hormone blood levels are checked after methotrexate treatment. This is to be sure the medicine is effective. It may take 4-6 weeks for the pregnancy to be absorbed (though most pregnancies will be absorbed by 3 weeks). °Surgical treatment may be needed. A laparoscope may be used to remove the pregnancy tissue. If severe internal bleeding occurs, a cut (incision) may be made in the lower abdomen (laparotomy), and the ectopic   pregnancy is removed. This stops the bleeding. Part of the fallopian tube, or the whole tube, may be removed as well (salpingectomy). After surgery, pregnancy hormone tests may be done to be sure there is no pregnancy tissue left. You may receive a Rho (D) immune globulin shot if you are Rh negative  and the father is Rh positive, or if you do not know the Rh type of the father. This is to prevent problems with any future pregnancy. °SEEK IMMEDIATE MEDICAL CARE IF:  °You have any symptoms of an ectopic pregnancy. This is a medical emergency. °MAKE SURE YOU: °· Understand these instructions. °· Will watch your condition. °· Will get help right away if you are not doing well or get worse. °Document Released: 06/19/2004 Document Revised: 09/26/2013 Document Reviewed: 12/09/2012 °[employer]® Patient Information ©2015 [employer]. This information is not intended to replace advice given to you by your health care provider. Make sure you discuss any questions you have with your health care provider. ° ° °

## 2014-03-11 NOTE — MAU Provider Note (Signed)
Attestation of Attending Supervision of Advanced Practitioner (PA/CNM/NP): Evaluation and management procedures were performed by the Advanced Practitioner under my supervision and collaboration.  I have reviewed the Advanced Practitioner's note and chart, and I agree with the management and plan.  Jacob Stinson, DO Attending Physician Faculty Practice, [facility]  

## 2014-03-17 ENCOUNTER — Ambulatory Visit (HOSPITAL_COMMUNITY)
Admission: RE | Admit: 2014-03-17 | Discharge: 2014-03-17 | Disposition: A | Discharge status: Home / Self Care | Payer: Medicaid Other | Source: Ambulatory Visit | Attending: Advanced Practice Midwife | Admitting: Advanced Practice Midwife

## 2014-03-17 ENCOUNTER — Other Ambulatory Visit (HOSPITAL_COMMUNITY): Payer: Self-pay | Admitting: Advanced Practice Midwife

## 2014-03-17 DIAGNOSIS — O3680X0 Pregnancy with inconclusive fetal viability, not applicable or unspecified: Secondary | ICD-10-CM

## 2014-03-17 DIAGNOSIS — Z36 Encounter for antenatal screening of mother: Secondary | ICD-10-CM | POA: Insufficient documentation

## 2014-03-17 NOTE — Progress Notes (Signed)
CYNAI SKEENS 37 y.o. [id number] @[redacted]w[redacted]d  by LMP Here to review [place] result  SUBJECTIVE: Seen here 03/08/14 Had normal rise in [place] on 10/16. F/U US today. No abd pain or bleeding. Hx baby with kidney anomaly, deceased.  EXAM:  NAD  TRANSVAGINAL OB ULTRASOUND  TECHNIQUE:  Transvaginal ultrasound was performed for complete evaluation of the  gestation as well as the maternal uterus, adnexal regions, and  pelvic cul-de-sac.  COMPARISON: Pelvic ultrasound 03/08/2014  FINDINGS:  Intrauterine gestational sac: Visualized/normal in shape.  Yolk sac: Present  Embryo: Not present  Cardiac Activity: Not present  MSD: 8 mm 5 w 3 d  Maternal uterus/adnexae: Normal right and left ovaries. No  subchorionic hemorrhage. No pelvic free fluid.  IMPRESSION:  Intrauterine gestational sac and yolk sac identified. Consider  follow-up ultrasound evaluation in 14 days.  Electronically Signed  By: Lovey Newcomer M.D.  On: 03/17/2014 16:37  PLAN: Reassured IUP, viability too early to determine. Intends to get NOB at [facility] and F/U at [facility] where she was seen before.   Lorene Dy, CNM 03/17/2014 4:46 PM

## 2014-03-27 ENCOUNTER — Encounter (HOSPITAL_COMMUNITY): Payer: Self-pay

## 2014-03-29 ENCOUNTER — Inpatient Hospital Stay (HOSPITAL_COMMUNITY)
Admission: AD | Admit: 2014-03-29 | Discharge: 2014-03-29 | Disposition: A | Discharge status: Home / Self Care | Payer: Medicaid Other | Source: Ambulatory Visit | Attending: Obstetrics & Gynecology | Admitting: Obstetrics & Gynecology

## 2014-03-29 ENCOUNTER — Encounter (HOSPITAL_COMMUNITY): Payer: Self-pay | Admitting: General Practice

## 2014-03-29 DIAGNOSIS — O99331 Smoking (tobacco) complicating pregnancy, first trimester: Secondary | ICD-10-CM | POA: Insufficient documentation

## 2014-03-29 DIAGNOSIS — O9989 Other specified diseases and conditions complicating pregnancy, childbirth and the puerperium: Secondary | ICD-10-CM | POA: Insufficient documentation

## 2014-03-29 DIAGNOSIS — Z3A01 Less than 8 weeks gestation of pregnancy: Secondary | ICD-10-CM | POA: Insufficient documentation

## 2014-03-29 DIAGNOSIS — F17210 Nicotine dependence, cigarettes, uncomplicated: Secondary | ICD-10-CM | POA: Diagnosis not present

## 2014-03-29 DIAGNOSIS — R109 Unspecified abdominal pain: Secondary | ICD-10-CM | POA: Diagnosis present

## 2014-03-29 DIAGNOSIS — O09299 Supervision of pregnancy with other poor reproductive or obstetric history, unspecified trimester: Secondary | ICD-10-CM

## 2014-03-29 DIAGNOSIS — M797 Fibromyalgia: Secondary | ICD-10-CM | POA: Insufficient documentation

## 2014-03-29 DIAGNOSIS — G8929 Other chronic pain: Secondary | ICD-10-CM | POA: Insufficient documentation

## 2014-03-29 DIAGNOSIS — F1920 Other psychoactive substance dependence, uncomplicated: Secondary | ICD-10-CM | POA: Clinically undetermined

## 2014-03-29 LAB — HCG, QUANTITATIVE, PREGNANCY: hCG, Beta Chain, Quant, S: 33082 m[IU]/mL — ABNORMAL HIGH (ref <5)

## 2014-03-29 MED ORDER — OXYCODONE-ACETAMINOPHEN 5-325 MG PO TABS: 1.0000 | ORAL_TABLET | Freq: Four times a day (QID) | ORAL | Status: DC | PRN

## 2014-03-29 NOTE — MAU Provider Note (Signed)
History     CSN: [id number]  Arrival date and time: 03/29/14 1441   None     Chief Complaint  Patient presents with  . Abdominal Pain  . Fatigue   HPI This is a 38 y.o. female at [redacted]w[redacted]d who presents requesting pain meds and an ultrasound. She has had an [place] which showed a yolk sac which confirms ruled/out ectopic. She "just wants to see the heartbeat". Also c/o generalized body pain. Denies abd cramping to me. Denies bleeding. States she has high anxiety related to her pain. Has been "self-medicating" with Percocet. States takes "3 a week".  Has been to [facility] and the [facility] in the past. Has not called them for recent pain. Has not been to pain clinic in a year.   [facility] late November. Wants to go to the [facility] clinic.    Rn note: Last preg was an ectopic. This preg is confirmed in the uterus, was told to get f/u US in 6wks to confirm FH. Wanting to see heartbeat. Has cramping off and on . Hx of fibromyalgia, has been having a lot of pain and fatigue, more than usual.          OB History    Gravida Para Term Preterm AB TAB SAB Ectopic Multiple Living   5 3 3  0 0 0 0 0 0 2      Past Medical History  Diagnosis Date  . Joint pain   . Fibromyalgia   . Anxiety     no meds since pregnancy  . Headache(784.0)     otc med prn  . Anemia     history with pregnancy    Past Surgical History  Procedure Laterality Date  . Back surgery    . Cesarean section      x 3  . Wisdom tooth extraction    . Multiple tooth extractions      upper dneures and lower partial    Family History  Problem Relation Age of Onset  . Anesthesia problems Neg Hx     History  Substance Use Topics  . Smoking status: Current Every Day Smoker -- 0.50 packs/day for 15 years    Types: Cigarettes  . Smokeless tobacco: Never Used  . Alcohol Use: Yes     Comment: socially but none with pregnancy    Allergies: No Known  Allergies  Prescriptions prior to admission  Medication Sig Dispense Refill Last Dose  . ibuprofen (ADVIL,MOTRIN) 200 MG tablet Take 800 mg by mouth every 6 (six) hours as needed for moderate pain.   03/07/2014 at Unknown time  . metroNIDAZOLE (FLAGYL) 500 MG tablet Take 1 tablet (500 mg total) by mouth 2 (two) times daily. 14 tablet 0   . oxyCODONE-acetaminophen (PERCOCET/ROXICET) 5-325 MG per tablet Take 2 tablets by mouth every 4 (four) hours as needed for moderate pain or severe pain. 10 tablet 0   . promethazine (PHENERGAN) 25 MG tablet Take 1 tablet (25 mg total) by mouth every 6 (six) hours as needed for nausea or vomiting. 30 tablet 0   . valACYclovir (VALTREX) 1000 MG tablet Take 1 tablet (1,000 mg total) by mouth 2 (two) times daily. 20 tablet 0     Review of Systems  Constitutional: Positive for malaise/fatigue. Negative for fever and chills.       "Total Body Pain"  Gastrointestinal: Positive for abdominal pain (reported to nurse, denies to me).  Musculoskeletal:  Positive for myalgias, back pain, joint pain and neck pain.  Neurological: Positive for weakness.  Psychiatric/Behavioral: Positive for depression and substance abuse. Negative for suicidal ideas. The patient is nervous/anxious.    Physical Exam   Blood pressure 125/80, pulse 85, temperature 98.7 F (37.1 C), temperature source Oral, resp. rate 18, height 5' 0.5" (1.537 m), weight 201 lb (91.173 kg), last menstrual period 02/12/2014, unknown if currently breastfeeding.  Physical Exam  Constitutional: She is oriented to person, place, and time. She appears well-developed and well-nourished. No distress.  HENT:  Head: Normocephalic.  Cardiovascular: Normal rate and regular rhythm.   Respiratory: Effort normal.  GI: Soft.  Musculoskeletal: Normal range of motion.  Neurological: She is alert and oriented to person, place, and time.  Skin: Skin is warm and dry.  Psychiatric: She has a normal mood and affect.     MAU Course  Procedures  MDM Discussed with Dr Glo Herring.  He will come see her himself to discuss plan  Assessment and Plan  A:  SIUP at [redacted]w[redacted]d        Hx fibromyalgia and chronic pain       Self-medicating with narcotics.  P:  Dr Glo Herring to follow  [facility] 03/29/2014, 5:03 PM   Patient interviewed, pain management issues of fibromyalgia discussed,  Will begin maintenance pain management  With Percocet 5/325, will Rx 2 wk supply at 5mg  q 6 h.Patient to bring Rx with her each visit for pill count. Pt asked to have goal of 3 tab/day.  Followup thru health dept, has apt later this month, pt will try to move appt up by seeking cancellation /workin apt.

## 2014-03-29 NOTE — MAU Note (Signed)
Last preg was an ectopic.  This preg is confirmed in the uterus, was told to get f/u US in 6wks to confirm FH.  Wanting to see heartbeat.  Has cramping off and on .   Hx of fibromyalgia, has been having a lot of pain and fatigue, more than usual.

## 2014-04-10 ENCOUNTER — Inpatient Hospital Stay (HOSPITAL_COMMUNITY)
Admission: AD | Admit: 2014-04-10 | Discharge: 2014-04-10 | Disposition: A | Discharge status: Home / Self Care | Payer: Medicaid Other | Source: Ambulatory Visit | Attending: Obstetrics and Gynecology | Admitting: Obstetrics and Gynecology

## 2014-04-10 ENCOUNTER — Encounter (HOSPITAL_COMMUNITY): Payer: Self-pay | Admitting: *Deleted

## 2014-04-10 DIAGNOSIS — O99331 Smoking (tobacco) complicating pregnancy, first trimester: Secondary | ICD-10-CM | POA: Insufficient documentation

## 2014-04-10 DIAGNOSIS — F1920 Other psychoactive substance dependence, uncomplicated: Secondary | ICD-10-CM

## 2014-04-10 DIAGNOSIS — O26891 Other specified pregnancy related conditions, first trimester: Secondary | ICD-10-CM | POA: Insufficient documentation

## 2014-04-10 DIAGNOSIS — M545 Low back pain: Secondary | ICD-10-CM | POA: Insufficient documentation

## 2014-04-10 DIAGNOSIS — M797 Fibromyalgia: Secondary | ICD-10-CM | POA: Diagnosis not present

## 2014-04-10 DIAGNOSIS — Z3A08 8 weeks gestation of pregnancy: Secondary | ICD-10-CM | POA: Diagnosis not present

## 2014-04-10 DIAGNOSIS — F17210 Nicotine dependence, cigarettes, uncomplicated: Secondary | ICD-10-CM | POA: Insufficient documentation

## 2014-04-10 DIAGNOSIS — O99321 Drug use complicating pregnancy, first trimester: Secondary | ICD-10-CM | POA: Insufficient documentation

## 2014-04-10 DIAGNOSIS — O2691 Pregnancy related conditions, unspecified, first trimester: Secondary | ICD-10-CM

## 2014-04-10 HISTORY — DX: Other intervertebral disc degeneration, lumbar region: M51.36

## 2014-04-10 HISTORY — DX: Other intervertebral disc degeneration, lumbar region without mention of lumbar back pain or lower extremity pain: M51.369

## 2014-04-10 MED ORDER — OXYCODONE-ACETAMINOPHEN 5-325 MG PO TABS: 1.0000 | ORAL_TABLET | Freq: Three times a day (TID) | ORAL | Status: DC | PRN

## 2014-04-10 NOTE — MAU Note (Addendum)
Patient states she has a diagnoses of fibromyalgia. States Dr. Glo Herring told her to come back to MAU for refill of her medication. Patient wants an ultrasound to confirm fetal viability. Patient states she has pain all the time. Denies bleeding and has a slight vaginal discharge. Some nausea with vomiting in the mornings but able to keep down food.

## 2014-04-10 NOTE — Discharge Instructions (Signed)
First Trimester of Pregnancy The first trimester of pregnancy is from week 1 until the end of week 12 (months 1 through 3). A week after a sperm fertilizes an egg, the egg will implant on the wall of the uterus. This embryo will begin to develop into a baby. Genes from you and your partner are forming the baby. The female genes determine whether the baby is a boy or a girl. At 6-8 weeks, the eyes and face are formed, and the heartbeat can be seen on ultrasound. At the end of 12 weeks, all the baby's organs are formed.  Now that you are pregnant, you will want to do everything you can to have a healthy baby. Two of the most important things are to get good prenatal care and to follow your health care provider's instructions. Prenatal care is all the medical care you receive before the baby's birth. This care will help prevent, find, and treat any problems during the pregnancy and childbirth. BODY CHANGES Your body goes through many changes during pregnancy. The changes vary from woman to woman.   You may gain or lose a couple of pounds at first.  You may feel sick to your stomach (nauseous) and throw up (vomit). If the vomiting is uncontrollable, call your health care provider.  You may tire easily.  You may develop headaches that can be relieved by medicines approved by your health care provider.  You may urinate more often. Painful urination may mean you have a bladder infection.  You may develop heartburn as a result of your pregnancy.  You may develop constipation because certain hormones are causing the muscles that push waste through your intestines to slow down.  You may develop hemorrhoids or swollen, bulging veins (varicose veins).  Your breasts may begin to grow larger and become tender. Your nipples may stick out more, and the tissue that surrounds them (areola) may become darker.  Your gums may bleed and may be sensitive to brushing and flossing.  Dark spots or blotches (chloasma,  mask of pregnancy) may develop on your face. This will likely fade after the baby is born.  Your menstrual periods will stop.  You may have a loss of appetite.  You may develop cravings for certain kinds of food.  You may have changes in your emotions from day to day, such as being excited to be pregnant or being concerned that something may go wrong with the pregnancy and baby.  You may have more vivid and strange dreams.  You may have changes in your hair. These can include thickening of your hair, rapid growth, and changes in texture. Some women also have hair loss during or after pregnancy, or hair that feels dry or thin. Your hair will most likely return to normal after your baby is born. WHAT TO EXPECT AT YOUR PRENATAL VISITS During a routine prenatal visit:  You will be weighed to make sure you and the baby are growing normally.  Your blood pressure will be taken.  Your abdomen will be measured to track your baby's growth.  The fetal heartbeat will be listened to starting around week 10 or 12 of your pregnancy.  Test results from any previous visits will be discussed. Your health care provider may ask you:  How you are feeling.  If you are feeling the baby move.  If you have had any abnormal symptoms, such as leaking fluid, bleeding, severe headaches, or abdominal cramping.  If you have any questions. Other tests   that may be performed during your first trimester include:  Blood tests to find your blood type and to check for the presence of any previous infections. They will also be used to check for low iron levels (anemia) and Rh antibodies. Later in the pregnancy, blood tests for diabetes will be done along with other tests if problems develop.  Urine tests to check for infections, diabetes, or protein in the urine.  An ultrasound to confirm the proper growth and development of the baby.  An amniocentesis to check for possible genetic problems.  Fetal screens for  spina bifida and Down syndrome.  You may need other tests to make sure you and the baby are doing well. HOME CARE INSTRUCTIONS  Medicines  Follow your health care provider's instructions regarding medicine use. Specific medicines may be either safe or unsafe to take during pregnancy.  Take your prenatal vitamins as directed.  If you develop constipation, try taking a stool softener if your health care provider approves. Diet  Eat regular, well-balanced meals. Choose a variety of foods, such as meat or vegetable-based protein, fish, milk and low-fat dairy products, vegetables, fruits, and whole grain breads and cereals. Your health care provider will help you determine the amount of weight gain that is right for you.  Avoid raw meat and uncooked cheese. These carry germs that can cause birth defects in the baby.  Eating four or five small meals rather than three large meals a day may help relieve nausea and vomiting. If you start to feel nauseous, eating a few soda crackers can be helpful. Drinking liquids between meals instead of during meals also seems to help nausea and vomiting.  If you develop constipation, eat more high-fiber foods, such as fresh vegetables or fruit and whole grains. Drink enough fluids to keep your urine clear or pale yellow. Activity and Exercise  Exercise only as directed by your health care provider. Exercising will help you:  Control your weight.  Stay in shape.  Be prepared for labor and delivery.  Experiencing pain or cramping in the lower abdomen or low back is a good sign that you should stop exercising. Check with your health care provider before continuing normal exercises.  Try to avoid standing for long periods of time. Move your legs often if you must stand in one place for a long time.  Avoid heavy lifting.  Wear low-heeled shoes, and practice good posture.  You may continue to have sex unless your health care provider directs you  otherwise. Relief of Pain or Discomfort  Wear a good support bra for breast tenderness.   Take warm sitz baths to soothe any pain or discomfort caused by hemorrhoids. Use hemorrhoid cream if your health care provider approves.   Rest with your legs elevated if you have leg cramps or low back pain.  If you develop varicose veins in your legs, wear support hose. Elevate your feet for 15 minutes, 3-4 times a day. Limit salt in your diet. Prenatal Care  Schedule your prenatal visits by the twelfth week of pregnancy. They are usually scheduled monthly at first, then more often in the last 2 months before delivery.  Write down your questions. Take them to your prenatal visits.  Keep all your prenatal visits as directed by your health care provider. Safety  Wear your seat belt at all times when driving.  Make a list of emergency phone numbers, including numbers for family, friends, the hospital, and police and fire departments. General Tips    Ask your health care provider for a referral to a local prenatal education class. Begin classes no later than at the beginning of month 6 of your pregnancy.  Ask for help if you have counseling or nutritional needs during pregnancy. Your health care provider can offer advice or refer you to specialists for help with various needs.  Do not use hot tubs, steam rooms, or saunas.  Do not douche or use tampons or scented sanitary pads.  Do not cross your legs for long periods of time.  Avoid cat litter boxes and soil used by cats. These carry germs that can cause birth defects in the baby and possibly loss of the fetus by miscarriage or stillbirth.  Avoid all smoking, herbs, alcohol, and medicines not prescribed by your health care provider. Chemicals in these affect the formation and growth of the baby.  Schedule a dentist appointment. At home, brush your teeth with a soft toothbrush and be gentle when you floss. SEEK MEDICAL CARE IF:   You have  dizziness.  You have mild pelvic cramps, pelvic pressure, or nagging pain in the abdominal area.  You have persistent nausea, vomiting, or diarrhea.  You have a bad smelling vaginal discharge.  You have pain with urination.  You notice increased swelling in your face, hands, legs, or ankles. SEEK IMMEDIATE MEDICAL CARE IF:   You have a fever.  You are leaking fluid from your vagina.  You have spotting or bleeding from your vagina.  You have severe abdominal cramping or pain.  You have rapid weight gain or loss.  You vomit blood or material that looks like coffee grounds.  You are exposed to German measles and have never had them.  You are exposed to fifth disease or chickenpox.  You develop a severe headache.  You have shortness of breath.  You have any kind of trauma, such as from a fall or a car accident. Document Released: 05/06/2001 Document Revised: 09/26/2013 Document Reviewed: 03/22/2013 ExitCare Patient Information 2015 [employer]. This information is not intended to replace advice given to you by your health care provider. Make sure you discuss any questions you have with your health care provider.  

## 2014-04-10 NOTE — MAU Provider Note (Signed)
History     CSN: [id number]  Arrival date and time: 04/10/14 1531   First Provider Initiated Contact with Patient 04/10/14 1646      No chief complaint on file.  HPI Kimberly Pace 38 y.o. [id number] presents at [redacted]w[redacted]d for refill of her percocet prescription and requesting ultrasound to help her worry less.  There is no new complaint today.  She is having no complaints related to pregnancy but is continuing to have fibromyalgia-related pain.  She reports Dr. Glo Pace gave her rx for percocet which she has been taking as prescribed.  Her last appt at the pain clinic was approx 03/24/13. She has stopped smoking but is now vaping x 4 days. OB History    Gravida Para Term Preterm AB TAB SAB Ectopic Multiple Living   5 3 3  0 0 0 0 0 0 2      Past Medical History  Diagnosis Date  . Joint pain   . Fibromyalgia   . Anxiety     no meds since pregnancy  . Headache(784.0)     otc med prn  . Anemia     history with pregnancy  . Degenerative lumbar disc     Past Surgical History  Procedure Laterality Date  . Back surgery    . Cesarean section      x 3  . Wisdom tooth extraction    . Multiple tooth extractions      upper dneures and lower partial    Family History  Problem Relation Age of Onset  . Anesthesia problems Neg Hx     History  Substance Use Topics  . Smoking status: Current Some Day Smoker -- 0.50 packs/day for 15 years    Types: Cigarettes  . Smokeless tobacco: Never Used  . Alcohol Use: Yes     Comment: socially but none with pregnancy    Allergies: No Known Allergies  Prescriptions prior to admission  Medication Sig Dispense Refill Last Dose  . HYDROcodone-acetaminophen (NORCO/VICODIN) 5-325 MG per tablet Take 3 tablets by mouth every 6 (six) hours as needed for moderate pain or severe pain.   Past Month at Unknown time  . oxyCODONE-acetaminophen (PERCOCET/ROXICET) 5-325 MG per tablet Take 1 tablet by mouth every 6 (six) hours as needed for moderate pain  or severe pain. 56 tablet 0 04/10/2014 at Unknown time  . Prenatal Vit-Fe Fumarate-FA (PRENATAL MULTIVITAMIN) TABS tablet Take 1 tablet by mouth daily at 12 noon.   04/10/2014 at Unknown time  . promethazine (PHENERGAN) 25 MG tablet Take 1 tablet (25 mg total) by mouth every 6 (six) hours as needed for nausea or vomiting. 30 tablet 0 04/09/2014 at Unknown time  . ibuprofen (ADVIL,MOTRIN) 200 MG tablet Take 800 mg by mouth every 6 (six) hours as needed for moderate pain.   over 1 month  . metroNIDAZOLE (FLAGYL) 500 MG tablet Take 1 tablet (500 mg total) by mouth 2 (two) times daily. (Patient not taking: Reported on 04/10/2014) 14 tablet 0 Past Month at Unknown time  . valACYclovir (VALTREX) 1000 MG tablet Take 1 tablet (1,000 mg total) by mouth 2 (two) times daily. (Patient not taking: Reported on 04/10/2014) 20 tablet 0 Past Month at Unknown time    Review of Systems  Constitutional: Negative for fever, chills and diaphoresis.  HENT: Negative for congestion and sore throat.   Eyes: Negative for blurred vision and double vision.  Respiratory: Negative for cough, shortness of breath and wheezing.   Cardiovascular: Negative for chest  pain and palpitations.  Gastrointestinal: Positive for heartburn, nausea and vomiting. Negative for abdominal pain, diarrhea and constipation.  Genitourinary: Positive for frequency. Negative for dysuria.  Musculoskeletal: Positive for myalgias, back pain, joint pain and neck pain. Negative for falls.  Skin: Negative for itching and rash.  Neurological: Positive for headaches. Negative for dizziness, tingling and weakness.  Psychiatric/Behavioral: Negative for depression and suicidal ideas. The patient is nervous/anxious.    Physical Exam   Blood pressure 141/91, pulse 100, temperature 98.5 F (36.9 C), temperature source Oral, resp. rate 16, height 5' (1.524 m), weight 204 lb (92.534 kg), last menstrual period 02/12/2014, SpO2 100 %, unknown if currently  breastfeeding.  Physical Exam  Constitutional: She is oriented to person, place, and time. She appears well-developed and well-nourished. No distress.  HENT:  Head: Normocephalic and atraumatic.  Eyes: EOM are normal.  Neck: Normal range of motion.  Cardiovascular: Normal rate, regular rhythm and normal heart sounds.   Respiratory: Effort normal and breath sounds normal. No respiratory distress.  GI: Soft. Bowel sounds are normal. She exhibits no distension. There is no tenderness. There is no rebound.  Musculoskeletal: Normal range of motion.  Neurological: She is alert and oriented to person, place, and time.  Skin: Skin is warm and dry.  Psychiatric: She has a normal mood and affect.    MAU Course  Procedures  MDM Discussed pt situation with Dr. Elly Pace.  She advises we will not be providing narcotic refills to pt regularly but will allow this one time, only until 11/25.  Pt to be advised that narcotic use in pregnancy can lead to withdrawal symptoms in baby, resulting in longer stay in NICU, potential behavioral concerns with baby, etc.  She will need to return to pain clinic for pain management.    Furthermore, no need for ultrasound at this time as she has previously confirmed IUP and no pregnancy-related complaint at this time.    During the visit, pt complained of anxiety and was offered vistaril.  She declined this.  Assessment and Plan  A:  1. Drug abuse and dependence   2. Fibromyalgia   3. Low back pain during pregnancy, first trimester    P: Discharge to home Pt informed we will not be able to further prescribe narcotics for pain management per Dr. Elly Pace.  I have given rx for percocet #24.  This should last through 11/25.  Further narcotics/pain management should be handled by the pain clinic.  She was a patient there up through 03/24/13 and is advised to call first thing tomorrow morning to re-establish care. Pt advised of the consequences to baby of narcotic use in  pregnancy including prolonged stay in NICU managing withdrawal symptoms. Obtain PNC asap.  Pt starting at HD on 11/25.   Patient may return to MAU as needed or if her condition were to change or worsen    [name] 04/10/2014, 4:53 PM

## 2014-04-27 ENCOUNTER — Other Ambulatory Visit (HOSPITAL_COMMUNITY): Payer: Self-pay | Admitting: Physician Assistant

## 2014-04-27 DIAGNOSIS — O09521 Supervision of elderly multigravida, first trimester: Secondary | ICD-10-CM

## 2014-04-28 ENCOUNTER — Other Ambulatory Visit (HOSPITAL_COMMUNITY): Payer: Self-pay | Admitting: Physician Assistant

## 2014-04-28 DIAGNOSIS — Z369 Encounter for antenatal screening, unspecified: Secondary | ICD-10-CM

## 2014-05-05 ENCOUNTER — Encounter (HOSPITAL_COMMUNITY): Payer: Self-pay

## 2014-05-05 ENCOUNTER — Inpatient Hospital Stay (HOSPITAL_COMMUNITY)
Admission: AD | Admit: 2014-05-05 | Discharge: 2014-05-06 | Disposition: A | Discharge status: Home / Self Care | Payer: Medicaid Other | Source: Ambulatory Visit | Attending: Obstetrics and Gynecology | Admitting: Obstetrics and Gynecology

## 2014-05-05 ENCOUNTER — Inpatient Hospital Stay (HOSPITAL_COMMUNITY): Payer: Medicaid Other

## 2014-05-05 DIAGNOSIS — O26851 Spotting complicating pregnancy, first trimester: Secondary | ICD-10-CM | POA: Diagnosis present

## 2014-05-05 DIAGNOSIS — O021 Missed abortion: Secondary | ICD-10-CM

## 2014-05-05 DIAGNOSIS — Z3A11 11 weeks gestation of pregnancy: Secondary | ICD-10-CM | POA: Insufficient documentation

## 2014-05-05 DIAGNOSIS — F17210 Nicotine dependence, cigarettes, uncomplicated: Secondary | ICD-10-CM | POA: Insufficient documentation

## 2014-05-05 DIAGNOSIS — Z0189 Encounter for other specified special examinations: Secondary | ICD-10-CM

## 2014-05-05 HISTORY — DX: Dorsalgia, unspecified: M54.9

## 2014-05-05 LAB — WET PREP, GENITAL
Clue Cells Wet Prep HPF POC: NONE SEEN
TRICH WET PREP: NONE SEEN
WBC, Wet Prep HPF POC: NONE SEEN
YEAST WET PREP: NONE SEEN

## 2014-05-05 LAB — CBC
HCT: 36.7 % (ref 36.0–46.0)
Hemoglobin: 12.5 g/dL (ref 12.0–15.0)
MCH: 29.2 pg (ref 26.0–34.0)
MCHC: 34.1 g/dL (ref 30.0–36.0)
MCV: 85.7 fL (ref 78.0–100.0)
PLATELETS: 237 10*3/uL (ref 150–400)
RBC: 4.28 MIL/uL (ref 3.87–5.11)
RDW: 13.6 % (ref 11.5–15.5)
WBC: 10.3 10*3/uL (ref 4.0–10.5)

## 2014-05-05 LAB — URINE MICROSCOPIC-ADD ON

## 2014-05-05 LAB — URINALYSIS, ROUTINE W REFLEX MICROSCOPIC
BILIRUBIN URINE: NEGATIVE
Glucose, UA: NEGATIVE mg/dL
Ketones, ur: NEGATIVE mg/dL
Leukocytes, UA: NEGATIVE
NITRITE: NEGATIVE
PROTEIN: NEGATIVE mg/dL
Specific Gravity, Urine: 1.01 (ref 1.005–1.030)
UROBILINOGEN UA: 0.2 mg/dL (ref 0.0–1.0)
pH: 5.5 (ref 5.0–8.0)

## 2014-05-05 NOTE — MAU Note (Signed)
Went to [facility] about an hour ago and saw bright red on tissue and alittle in toilet. Small amt cramping.

## 2014-05-05 NOTE — MAU Provider Note (Signed)
History     CSN: [id number]  Arrival date and time: 05/05/14 9381   First Provider Initiated Contact with Patient 05/05/14 2208      Chief Complaint  Patient presents with  . Vaginal Bleeding  . Abdominal Cramping   HPI BRADY PLANT 38 y.o. [id number] @[redacted]w[redacted]d  presents to MAU complaining of vaginal spotting.   She saw a small amt of bright red blood in toilet and on tissue.  Since coming here, she noticed it when giving urine sample but none since.  She has had a little cramping and a little sharp pain but none in the last 15-20 minutes.  She denies abdominal pain, dysuria, headache, fever, weakness.  OB History    Gravida Para Term Preterm AB TAB SAB Ectopic Multiple Living   5 3 3  0 1 0 0 1 0 2      Past Medical History  Diagnosis Date  . Joint pain   . Fibromyalgia   . Anxiety     no meds since pregnancy  . Headache(784.0)     otc med prn  . Anemia     history with pregnancy  . Degenerative lumbar disc   . Back pain     Past Surgical History  Procedure Laterality Date  . Back surgery    . Cesarean section      x 3  . Wisdom tooth extraction    . Multiple tooth extractions      upper dneures and lower partial    Family History  Problem Relation Age of Onset  . Anesthesia problems Neg Hx   . Asthma Mother   . Hypertension Mother     History  Substance Use Topics  . Smoking status: Light Tobacco Smoker -- 0.50 packs/day for 15 years    Types: Cigarettes  . Smokeless tobacco: Never Used  . Alcohol Use: Yes     Comment: socially but none with pregnancy.      Allergies: No Known Allergies  Prescriptions prior to admission  Medication Sig Dispense Refill Last Dose  . oxyCODONE-acetaminophen (PERCOCET/ROXICET) 5-325 MG per tablet Take 1 tablet by mouth 3 (three) times daily as needed for moderate pain or severe pain. 24 tablet 0 05/05/2014 at 1600  . Prenatal Vit-Fe Fumarate-FA (PRENATAL MULTIVITAMIN) TABS tablet Take 1 tablet by mouth daily at 12  noon.   05/05/2014 at Unknown time  . promethazine (PHENERGAN) 25 MG tablet Take 1 tablet (25 mg total) by mouth every 6 (six) hours as needed for nausea or vomiting. 30 tablet 0 Past Month at Unknown time    ROS Pertinent ROS in HPI Physical Exam   Blood pressure 137/85, pulse 82, temperature 98 F (36.7 C), resp. rate 18, height 5' (1.524 m), weight 207 lb 3.2 oz (93.985 kg), last menstrual period 02/12/2014, unknown if currently breastfeeding.  Physical Exam  Constitutional: She is oriented to person, place, and time. She appears well-developed and well-nourished.  HENT:  Head: Normocephalic and atraumatic.  Eyes: EOM are normal.  Neck: Normal range of motion.  Cardiovascular: Normal rate and regular rhythm.   Respiratory: Effort normal and breath sounds normal.  GI: Soft. She exhibits no distension. There is no tenderness. There is no rebound and no guarding.  Genitourinary:  Moderate amt of tan colored adherent homogenous discharge in vagina No CMT/No adnexal mass or tenderness  Musculoskeletal: Normal range of motion.  Neurological: She is alert and oriented to person, place, and time.  Skin: Skin is warm and dry.  Psychiatric: She has a normal mood and affect.   Results for orders placed or performed during the hospital encounter of 05/05/14 (from the past 24 hour(s))  Urinalysis, Routine w reflex microscopic     Status: Abnormal   Collection Time: 05/05/14  8:30 PM  Result Value Ref Range   Color, Urine YELLOW YELLOW   APPearance CLEAR CLEAR   Specific Gravity, Urine 1.010 1.005 - 1.030   pH 5.5 5.0 - 8.0   Glucose, UA NEGATIVE NEGATIVE mg/dL   Hgb urine dipstick SMALL (A) NEGATIVE   Bilirubin Urine NEGATIVE NEGATIVE   Ketones, ur NEGATIVE NEGATIVE mg/dL   Protein, ur NEGATIVE NEGATIVE mg/dL   Urobilinogen, UA 0.2 0.0 - 1.0 mg/dL   Nitrite NEGATIVE NEGATIVE   Leukocytes, UA NEGATIVE NEGATIVE  Urine microscopic-add on     Status: Abnormal   Collection Time: 05/05/14   8:30 PM  Result Value Ref Range   Squamous Epithelial / LPF FEW (A) RARE   WBC, UA 0-2 <3 WBC/hpf   RBC / HPF 3-6 <3 RBC/hpf   Bacteria, UA FEW (A) RARE  Wet prep, genital     Status: None   Collection Time: 05/05/14 10:24 PM  Result Value Ref Range   Yeast Wet Prep HPF POC NONE SEEN NONE SEEN   Trich, Wet Prep NONE SEEN NONE SEEN   Clue Cells Wet Prep HPF POC NONE SEEN NONE SEEN   WBC, Wet Prep HPF POC NONE SEEN NONE SEEN  CBC     Status: None   Collection Time: 05/05/14 10:35 PM  Result Value Ref Range   WBC 10.3 4.0 - 10.5 K/uL   RBC 4.28 3.87 - 5.11 MIL/uL   Hemoglobin 12.5 12.0 - 15.0 g/dL   HCT 36.7 36.0 - 46.0 %   MCV 85.7 78.0 - 100.0 fL   MCH 29.2 26.0 - 34.0 pg   MCHC 34.1 30.0 - 36.0 g/dL   RDW 13.6 11.5 - 15.5 %   Platelets 237 150 - 400 K/uL   US Ob Transvaginal  05/05/2014   CLINICAL DATA:  Bleeding and cramping tonight about 7 p.m. Estimated gestational age by LMP is 11 weeks 5 days. Quantitative beta HCG is not reported.  EXAM: TRANSVAGINAL OB ULTRASOUND  TECHNIQUE: Transvaginal ultrasound was performed for complete evaluation of the gestation as well as the maternal uterus, adnexal regions, and pelvic cul-de-sac.  COMPARISON:  03/17/2014  FINDINGS: Intrauterine gestational sac: An intrauterine gestational sac is demonstrated. There is a thick septation with the second cystic fluid collection adjacent to the intrauterine gestational sac. This could represent a liquefied subchorionic hemorrhage traverses a second blighted gestational sac. No fetal pole or yolk sac is identified within this smaller fluid collection. If the smaller collection represents a gestational sac, mean sac diameter is 2.1 cm consistent with an estimated gestational age is 7 weeks 0 days.  Yolk sac:  Yolk sac is identified in the main gestational sac.  Embryo:  Fetal pole is identified in the main gestational sac.  Cardiac Activity: Fetal cardiac activity is not observed.  CRL:   27.8  mm   9 w for d                   [place] EDC: 12/04/2014  Maternal uterus/adnexae: No myometrial mass lesions identified. A small subchorionic hemorrhage is present. Both ovaries are identified with limited visualization bone appear unremarkable. Minimal free pelvic fluid.  IMPRESSION: Intrauterine gestational sac with fetal pole and yolk sac identified.  Fetal cardiac activity is not observed with a crown-rump length of 27.8 mm. This meets criteria for intrauterine fetal demise.  There is a second cystic structure in the endometrium without fetal pole or yolk sac identified which may indicate an subchorionic hemorrhage or second blighted gestational sac.   Electronically Signed   By: Lucienne Capers M.D.   On: 05/05/2014 23:46    MAU Course  Procedures  MDM  Consult with Dr. Glo Herring who came to bedside for ultrasound.  No heartbeat seen on bedside u/s.  Formal u/s ordered confirming IUFD.  Dr. Glo Herring back to bedside to discuss options: offered D&C for following morning at 9am or pt may wait until next week with other provider. Pt chooses to return tomorrow.    Assessment and Plan  A:  1. Missed abortion   2. Encounter for imaging to assess myocardial viability    P: Discharge to home Rest NPO Return at 7:30am for D&C scheduled with Glo Herring at [facility] Patient may return to MAU as needed or if her condition were to change or worsen   [name] 05/05/2014, 10:09 PM

## 2014-05-06 ENCOUNTER — Inpatient Hospital Stay (HOSPITAL_COMMUNITY): Payer: Medicaid Other | Admitting: Anesthesiology

## 2014-05-06 ENCOUNTER — Ambulatory Visit (HOSPITAL_COMMUNITY)
Admission: AD | Admit: 2014-05-06 | Discharge: 2014-05-06 | Disposition: A | Discharge status: Home / Self Care | Payer: Medicaid Other | Source: Ambulatory Visit | Attending: Family Medicine | Admitting: Family Medicine

## 2014-05-06 ENCOUNTER — Encounter (HOSPITAL_COMMUNITY)
Admission: AD | Disposition: A | Discharge status: Home / Self Care | Payer: Self-pay | Source: Ambulatory Visit | Attending: Family Medicine

## 2014-05-06 ENCOUNTER — Encounter (HOSPITAL_COMMUNITY): Payer: Self-pay | Admitting: *Deleted

## 2014-05-06 ENCOUNTER — Encounter (HOSPITAL_COMMUNITY)
Admission: AD | Disposition: A | Discharge status: Home / Self Care | Payer: Self-pay | Source: Ambulatory Visit | Attending: Obstetrics and Gynecology

## 2014-05-06 DIAGNOSIS — O99011 Anemia complicating pregnancy, first trimester: Secondary | ICD-10-CM | POA: Diagnosis not present

## 2014-05-06 DIAGNOSIS — O021 Missed abortion: Secondary | ICD-10-CM | POA: Diagnosis not present

## 2014-05-06 DIAGNOSIS — F419 Anxiety disorder, unspecified: Secondary | ICD-10-CM | POA: Diagnosis not present

## 2014-05-06 DIAGNOSIS — M797 Fibromyalgia: Secondary | ICD-10-CM | POA: Diagnosis not present

## 2014-05-06 HISTORY — PX: DILATION AND EVACUATION: SHX1459

## 2014-05-06 LAB — HIV ANTIBODY (ROUTINE TESTING W REFLEX): HIV 1&2 Ab, 4th Generation: NONREACTIVE

## 2014-05-06 LAB — GC/CHLAMYDIA PROBE AMP
CT Probe RNA: NEGATIVE
GC Probe RNA: NEGATIVE

## 2014-05-06 SURGERY — DILATION AND EVACUATION, UTERUS
Anesthesia: General | Site: Vagina

## 2014-05-06 SURGERY — DILATION AND EVACUATION, UTERUS
Anesthesia: Monitor Anesthesia Care

## 2014-05-06 MED ORDER — DEXTROSE 5 % IV SOLN
100.0000 mg | INTRAVENOUS | Status: AC
  Administered 2014-05-06: 100 mg via INTRAVENOUS
  Filled 2014-05-06: qty 100

## 2014-05-06 MED ORDER — IBUPROFEN 600 MG PO TABS: 600.0000 mg | ORAL_TABLET | Freq: Four times a day (QID) | ORAL | Status: DC | PRN

## 2014-05-06 MED ORDER — SUCCINYLCHOLINE CHLORIDE 20 MG/ML IJ SOLN
INTRAMUSCULAR | Status: DC | PRN
  Administered 2014-05-06: 100 mg via INTRAVENOUS

## 2014-05-06 MED ORDER — MIDAZOLAM HCL 2 MG/2ML IJ SOLN
INTRAMUSCULAR | Status: AC
  Filled 2014-05-06: qty 2

## 2014-05-06 MED ORDER — PROPOFOL 10 MG/ML IV EMUL
INTRAVENOUS | Status: AC
  Filled 2014-05-06: qty 20

## 2014-05-06 MED ORDER — HYDROMORPHONE HCL 1 MG/ML IJ SOLN
0.2500 mg | INTRAMUSCULAR | Status: DC | PRN
  Administered 2014-05-06 (×2): 0.5 mg via INTRAVENOUS

## 2014-05-06 MED ORDER — CITRIC ACID-SODIUM CITRATE 334-500 MG/5ML PO SOLN
30.0000 mL | Freq: Once | ORAL | Status: AC
  Administered 2014-05-06: 30 mL via ORAL
  Filled 2014-05-06: qty 15

## 2014-05-06 MED ORDER — FENTANYL CITRATE 0.05 MG/ML IJ SOLN
INTRAMUSCULAR | Status: DC | PRN
  Administered 2014-05-06 (×3): 50 ug via INTRAVENOUS
  Administered 2014-05-06: 100 ug via INTRAVENOUS

## 2014-05-06 MED ORDER — LACTATED RINGERS IV SOLN
INTRAVENOUS | Status: DC
Start: 2014-05-06 — End: 2014-05-06
  Administered 2014-05-06: 12:00:00 via INTRAVENOUS
  Administered 2014-05-06: 125 mL/h via INTRAVENOUS

## 2014-05-06 MED ORDER — KETOROLAC TROMETHAMINE 15 MG/ML IJ SOLN
15.0000 mg | Freq: Once | INTRAMUSCULAR | Status: AC
  Filled 2014-05-06: qty 1

## 2014-05-06 MED ORDER — KETOROLAC TROMETHAMINE 30 MG/ML IJ SOLN
INTRAMUSCULAR | Status: AC
Start: 2014-05-06 — End: 2014-05-06
  Administered 2014-05-06: 15 mg
  Filled 2014-05-06: qty 1

## 2014-05-06 MED ORDER — LIDOCAINE HCL 1 % IJ SOLN
INTRAMUSCULAR | Status: AC
  Filled 2014-05-06: qty 20

## 2014-05-06 MED ORDER — PROPOFOL 10 MG/ML IV BOLUS
INTRAVENOUS | Status: DC | PRN
  Administered 2014-05-06: 200 mg via INTRAVENOUS
  Administered 2014-05-06: 100 mg via INTRAVENOUS

## 2014-05-06 MED ORDER — LIDOCAINE HCL 1 % IJ SOLN
INTRAMUSCULAR | Status: DC | PRN
  Administered 2014-05-06: 20 mL

## 2014-05-06 MED ORDER — OXYCODONE HCL 5 MG PO TABS
ORAL_TABLET | ORAL | Status: AC
  Filled 2014-05-06: qty 1

## 2014-05-06 MED ORDER — OXYCODONE HCL 5 MG/5ML PO SOLN: 5.0000 mg | Freq: Once | ORAL | Status: AC | PRN

## 2014-05-06 MED ORDER — MIDAZOLAM HCL 2 MG/2ML IJ SOLN
INTRAMUSCULAR | Status: DC | PRN
  Administered 2014-05-06: 2 mg via INTRAVENOUS

## 2014-05-06 MED ORDER — DEXAMETHASONE SODIUM PHOSPHATE 10 MG/ML IJ SOLN
INTRAMUSCULAR | Status: DC | PRN
  Administered 2014-05-06: 4 mg via INTRAVENOUS

## 2014-05-06 MED ORDER — FENTANYL CITRATE 0.05 MG/ML IJ SOLN
INTRAMUSCULAR | Status: AC
  Filled 2014-05-06: qty 5

## 2014-05-06 MED ORDER — DEXAMETHASONE SODIUM PHOSPHATE 4 MG/ML IJ SOLN
INTRAMUSCULAR | Status: AC
  Filled 2014-05-06: qty 1

## 2014-05-06 MED ORDER — ONDANSETRON HCL 4 MG/2ML IJ SOLN
INTRAMUSCULAR | Status: DC | PRN
  Administered 2014-05-06: 4 mg via INTRAVENOUS

## 2014-05-06 MED ORDER — HYDROMORPHONE HCL 1 MG/ML IJ SOLN
INTRAMUSCULAR | Status: AC
  Administered 2014-05-06: 0.5 mg via INTRAVENOUS
  Filled 2014-05-06: qty 1

## 2014-05-06 MED ORDER — OXYCODONE HCL 5 MG PO TABS
5.0000 mg | ORAL_TABLET | Freq: Once | ORAL | Status: AC | PRN
  Administered 2014-05-06: 5 mg via ORAL

## 2014-05-06 MED ORDER — ONDANSETRON HCL 4 MG/2ML IJ SOLN
INTRAMUSCULAR | Status: AC
  Filled 2014-05-06: qty 2

## 2014-05-06 MED ORDER — MORPHINE SULFATE 4 MG/ML IJ SOLN
0.5000 mg | Freq: Once | INTRAMUSCULAR | Status: AC
Start: 2014-05-06 — End: 2014-05-06
  Administered 2014-05-06: 0.52 mg via INTRAVENOUS
  Filled 2014-05-06: qty 1

## 2014-05-06 MED ORDER — LIDOCAINE HCL (CARDIAC) 20 MG/ML IV SOLN
INTRAVENOUS | Status: DC | PRN
  Administered 2014-05-06: 30 mg via INTRAVENOUS

## 2014-05-06 SURGICAL SUPPLY — 18 items
CATH ROBINSON RED A/P 16FR (CATHETERS) ×3 IMPLANT
CLOTH BEACON ORANGE TIMEOUT ST (SAFETY) ×3 IMPLANT
DECANTER SPIKE VIAL GLASS SM (MISCELLANEOUS) ×3 IMPLANT
GLOVE ECLIPSE 9.0 STRL (GLOVE) IMPLANT
GOWN STRL REUS W/TWL 2XL LVL3 (GOWN DISPOSABLE) ×3 IMPLANT
GOWN STRL REUS W/TWL LRG LVL3 (GOWN DISPOSABLE) ×3 IMPLANT
KIT BERKELEY 1ST TRIMESTER 3/8 (MISCELLANEOUS) ×3 IMPLANT
NS IRRIG 1000ML POUR BTL (IV SOLUTION) ×3 IMPLANT
PACK VAGINAL MINOR WOMEN LF (CUSTOM PROCEDURE TRAY) ×3 IMPLANT
PAD OB MATERNITY 4.3X12.25 (PERSONAL CARE ITEMS) ×3 IMPLANT
PAD PREP 24X48 CUFFED NSTRL (MISCELLANEOUS) ×3 IMPLANT
SET BERKELEY SUCTION TUBING (SUCTIONS) ×3 IMPLANT
TOWEL OR 17X24 6PK STRL BLUE (TOWEL DISPOSABLE) ×6 IMPLANT
VACURETTE 10 RIGID CVD (CANNULA) ×3 IMPLANT
VACURETTE 12 RIGID CVD (CANNULA) IMPLANT
VACURETTE 7MM CVD STRL WRAP (CANNULA) IMPLANT
VACURETTE 8 RIGID CVD (CANNULA) IMPLANT
VACURETTE 9 RIGID CVD (CANNULA) ×3 IMPLANT

## 2014-05-06 SURGICAL SUPPLY — 18 items
CATH ROBINSON RED A/P 16FR (CATHETERS) IMPLANT
CLOTH BEACON ORANGE TIMEOUT ST (SAFETY) IMPLANT
DECANTER SPIKE VIAL GLASS SM (MISCELLANEOUS) IMPLANT
GLOVE ECLIPSE 9.0 STRL (GLOVE) IMPLANT
GOWN STRL REUS W/TWL 2XL LVL3 (GOWN DISPOSABLE) IMPLANT
GOWN STRL REUS W/TWL LRG LVL3 (GOWN DISPOSABLE) IMPLANT
KIT BERKELEY 1ST TRIMESTER 3/8 (MISCELLANEOUS) IMPLANT
NS IRRIG 1000ML POUR BTL (IV SOLUTION) IMPLANT
PACK VAGINAL MINOR WOMEN LF (CUSTOM PROCEDURE TRAY) IMPLANT
PAD OB MATERNITY 4.3X12.25 (PERSONAL CARE ITEMS) IMPLANT
PAD PREP 24X48 CUFFED NSTRL (MISCELLANEOUS) IMPLANT
SET BERKELEY SUCTION TUBING (SUCTIONS) IMPLANT
TOWEL OR 17X24 6PK STRL BLUE (TOWEL DISPOSABLE) IMPLANT
VACURETTE 10 RIGID CVD (CANNULA) IMPLANT
VACURETTE 12 RIGID CVD (CANNULA) IMPLANT
VACURETTE 7MM CVD STRL WRAP (CANNULA) IMPLANT
VACURETTE 8 RIGID CVD (CANNULA) IMPLANT
VACURETTE 9 RIGID CVD (CANNULA) IMPLANT

## 2014-05-06 NOTE — Discharge Instructions (Signed)
Dilation and Curettage or Vacuum Curettage Dilation and curettage (D&C) and vacuum curettage are minor procedures. A D&C involves stretching (dilation) the cervix and scraping (curettage) the inside lining of the womb (uterus). During a D&C, tissue is gently scraped from the inside lining of the uterus. During a vacuum curettage, the lining and tissue in the uterus are removed with the use of gentle suction.  Curettage may be performed to either diagnose or treat a problem. As a diagnostic procedure, curettage is performed to examine tissues from the uterus. A diagnostic curettage may be performed for the following symptoms:   Irregular bleeding in the uterus.   Bleeding with the development of clots.   Spotting between menstrual periods.   Prolonged menstrual periods.   Bleeding after menopause.   No menstrual period (amenorrhea).   A change in size and shape of the uterus.  As a treatment procedure, curettage may be performed for the following reasons:   Removal of an IUD (intrauterine device).   Removal of retained placenta after giving birth. Retained placenta can cause an infection or bleeding severe enough to require transfusions.   Abortion.   Miscarriage.   Removal of polyps inside the uterus.   Removal of uncommon types of noncancerous lumps (fibroids).  LET [facility] CARE PROVIDER KNOW ABOUT:   Any allergies you have.   All medicines you are taking, including vitamins, herbs, eye drops, creams, and over-the-counter medicines.   Previous problems you or members of your family have had with the use of anesthetics.   Any blood disorders you have.   Previous surgeries you have had.   Medical conditions you have. RISKS AND COMPLICATIONS  Generally, this is a safe procedure. However, as with any procedure, complications can occur. Possible complications include:  Excessive bleeding.   Infection of the uterus.   Damage to the cervix.    Development of scar tissue (adhesions) inside the uterus, later causing abnormal amounts of menstrual bleeding.   Complications from the general anesthetic, if a general anesthetic is used.   Putting a hole (perforation) in the uterus. This is rare.  BEFORE THE PROCEDURE   Eat and drink before the procedure only as directed by your health care provider.   Arrange for someone to take you home.  PROCEDURE  This procedure usually takes about 15-30 minutes.  You will be given one of the following:  A medicine that numbs the area in and around the cervix (local anesthetic).   A medicine to make you sleep through the procedure (general anesthetic).  You will lie on your back with your legs in stirrups.   A warm metal or plastic instrument (speculum) will be placed in your vagina to keep it open and to allow the health care provider to see the cervix.  There are two ways in which your cervix can be softened and dilated. These include:   Taking a medicine.   Having thin rods (laminaria) inserted into your cervix.   A curved tool (curette) will be used to scrape cells from the inside lining of the uterus. In some cases, gentle suction is applied with the curette. The curette will then be removed.  AFTER THE PROCEDURE   You will rest in the recovery area until you are stable and are ready to go home.   You may feel sick to your stomach (nauseous) or throw up (vomit) if you were given a general anesthetic.   You may have a sore throat if a tube  was placed in your throat during general anesthesia.   You may have light cramping and bleeding. This may last for 2 days to 2 weeks after the procedure.   Your uterus needs to make a new lining after the procedure. This may make your next period late. Document Released: 05/12/2005 Document Revised: 01/12/2013 Document Reviewed: 12/09/2012 [employer] Patient Information 2015 [employer]. This information is not intended to  replace advice given to you by your health care provider. Make sure you discuss any questions you have with your health care provider.  

## 2014-05-06 NOTE — MAU Provider Note (Addendum)
Kimberly Pace is an 38 y.o. female. She is a [id number] @ [redacted]w[redacted]d by menstrual hx who was seen at +-  Midnight with the dx of missed abortion, with a fetal pole c/w 9+ wk gest age, with absent fetal cardiac activity. The patient was counselled for treatment options and is scheduled for suction D&C this a.m.   Pt was originally scheduled for 9 a.m, will be done as OR is available due to emergency cases .  Procedure has been reviewed with the patient, including risks, rationale, alternatives.  Pt consents to Suction dilation and curettage.   Pertinent Gynecological History: Menses: Lmp 02/12/14 Bleeding: began spotting last nite,  Contraception: none DES exposure: unknown Blood transfusions: none Sexually transmitted diseases: no past history Previous GYN Procedures: cesarean x 3, c-section scar ectopic after last pregnancy, ectopic tx'd MTX  Last mammogram:  Date:  Last pap:  Date:  OB History: G5, P3012   Menstrual History: Menarche age:  Patient's last menstrual period was 02/12/2014.    Past Medical History  Diagnosis Date  . Joint pain   . Fibromyalgia   . Anxiety     no meds since pregnancy  . Headache(784.0)     otc med prn  . Anemia     history with pregnancy  . Degenerative lumbar disc   . Back pain     Past Surgical History  Procedure Laterality Date  . Back surgery    . Cesarean section      x 3  . Wisdom tooth extraction    . Multiple tooth extractions      upper dneures and lower partial    Family History  Problem Relation Age of Onset  . Anesthesia problems Neg Hx   . Asthma Mother   . Hypertension Mother     Social History:  reports that she has been smoking Cigarettes.  She has a 7.5 pack-year smoking history. She has never used smokeless tobacco. She reports that she drinks alcohol. She reports that she does not use illicit drugs.  Allergies: No Known Allergies  Prescriptions prior to admission  Medication Sig Dispense Refill Last Dose  .  oxyCODONE-acetaminophen (PERCOCET/ROXICET) 5-325 MG per tablet Take 1 tablet by mouth 3 (three) times daily as needed for moderate pain or severe pain. 24 tablet 0 05/05/2014 at 1600  . Prenatal Vit-Fe Fumarate-FA (PRENATAL MULTIVITAMIN) TABS tablet Take 1 tablet by mouth daily at 12 noon.   05/05/2014 at Unknown time  . promethazine (PHENERGAN) 25 MG tablet Take 1 tablet (25 mg total) by mouth every 6 (six) hours as needed for nausea or vomiting. 30 tablet 0 Past Month at Unknown time    ROS  Blood pressure 122/73, pulse 73, temperature 98 F (36.7 C), resp. rate 16, last menstrual period 02/12/2014, unknown if currently breastfeeding. Physical Exam  Constitutional: She appears well-developed and well-nourished.  HENT:  Head: Normocephalic.  Eyes: Pupils are equal, round, and reactive to light.  Neck: Normal range of motion.  Cardiovascular: Normal rate and regular rhythm.   Respiratory: Effort normal.  GI: Soft.  Genitourinary:  Exam last pm :  Genitourinary:  Moderate amt of tan colored adherent homogenous discharge in vagina No CMT/No adnexal mass or tenderness  Musculoskeletal: Normal range of motion.  Neurological: She is alert and oriented to person, place, and time.  Skin: Skin is warm and dry.  Results for orders placed or performed during the hospital encounter of 05/05/14 (from the past 24 hour(s))  Urinalysis, Routine w reflex microscopic     Status: Abnormal   Collection Time: 05/05/14  8:30 PM  Result Value Ref Range   Color, Urine YELLOW YELLOW   APPearance CLEAR CLEAR   Specific Gravity, Urine 1.010 1.005 - 1.030   pH 5.5 5.0 - 8.0   Glucose, UA NEGATIVE NEGATIVE mg/dL   Hgb urine dipstick SMALL (A) NEGATIVE   Bilirubin Urine NEGATIVE NEGATIVE   Ketones, ur NEGATIVE NEGATIVE mg/dL   Protein, ur NEGATIVE NEGATIVE mg/dL   Urobilinogen, UA 0.2 0.0 - 1.0 mg/dL   Nitrite NEGATIVE NEGATIVE   Leukocytes, UA  NEGATIVE NEGATIVE  Urine microscopic-add on     Status: Abnormal   Collection Time: 05/05/14  8:30 PM  Result Value Ref Range   Squamous Epithelial / LPF FEW (A) RARE   WBC, UA 0-2 <3 WBC/hpf   RBC / HPF 3-6 <3 RBC/hpf   Bacteria, UA FEW (A) RARE  GC/Chlamydia Probe Amp     Status: None   Collection Time: 05/05/14 10:24 PM  Result Value Ref Range   CT Probe RNA NEGATIVE NEGATIVE   GC Probe RNA NEGATIVE NEGATIVE  Wet prep, genital     Status: None   Collection Time: 05/05/14 10:24 PM  Result Value Ref Range   Yeast Wet Prep HPF POC NONE SEEN NONE SEEN   Trich, Wet Prep NONE SEEN NONE SEEN   Clue Cells Wet Prep HPF POC NONE SEEN NONE SEEN   WBC, Wet Prep HPF POC NONE SEEN NONE SEEN  CBC     Status: None   Collection Time: 05/05/14 10:35 PM  Result Value Ref Range   WBC 10.3 4.0 - 10.5 K/uL   RBC 4.28 3.87 - 5.11 MIL/uL   Hemoglobin 12.5 12.0 - 15.0 g/dL   HCT 36.7 36.0 - 46.0 %   MCV 85.7 78.0 - 100.0 fL   MCH 29.2 26.0 - 34.0 pg   MCHC 34.1 30.0 - 36.0 g/dL   RDW 13.6 11.5 - 15.5 %   Platelets 237 150 - 400 K/uL  HIV antibody     Status: None   Collection Time: 05/05/14 10:35 PM  Result Value Ref Range   HIV 1&2 Ab, 4th Generation NONREACTIVE NONREACTIVE    US Ob Transvaginal  05/05/2014   CLINICAL DATA:  Bleeding and cramping tonight about 7 p.m. Estimated gestational age by LMP is 11 weeks 5 days. Quantitative beta HCG is not reported.  EXAM: TRANSVAGINAL OB ULTRASOUND  TECHNIQUE: Transvaginal ultrasound was performed for complete evaluation of the gestation as well as the maternal uterus, adnexal regions, and pelvic cul-de-sac.  COMPARISON:  03/17/2014  FINDINGS: Intrauterine gestational sac: An intrauterine gestational sac is demonstrated. There is a thick septation with the second cystic fluid collection adjacent to the intrauterine gestational sac. This could represent a liquefied subchorionic hemorrhage traverses a second blighted gestational sac. No fetal pole or yolk  sac is identified within this smaller fluid collection. If the smaller collection represents a gestational sac, mean sac diameter is 2.1 cm consistent with an estimated gestational age is 7 weeks 0 days.  Yolk sac:  Yolk sac is identified in the main gestational sac.  Embryo:  Fetal pole is identified in the main gestational sac.  Cardiac Activity: Fetal cardiac activity is not observed.  CRL:   27.8  mm   9 w for d  [place] EDC: 12/04/2014  Maternal uterus/adnexae: No myometrial mass lesions identified. A small subchorionic hemorrhage is present. Both ovaries are identified with limited visualization bone appear unremarkable. Minimal free pelvic fluid.  IMPRESSION: Intrauterine gestational sac with fetal pole and yolk sac identified. Fetal cardiac activity is not observed with a crown-rump length of 27.8 mm. This meets criteria for intrauterine fetal demise.  There is a second cystic structure in the endometrium without fetal pole or yolk sac identified which may indicate an subchorionic hemorrhage or second blighted gestational sac.   Electronically Signed   By: Lucienne Capers M.D.   On: 05/05/2014 23:46    Assessment/Plan: Missed abortion 11w6 d Hx prior c/s x 3. For suction Dilation and curettage.  Case delayed; Dr Kennon Rounds will proceed with Dilation and curettage.  Alitza Cowman V 05/06/2014, 9:40 AM

## 2014-05-06 NOTE — Anesthesia Postprocedure Evaluation (Signed)
  Anesthesia Post-op Note  Patient: Kimberly Pace  Procedure(s) Performed: Procedure(s): DILATATION AND EVACUATION (N/A)  Patient Location: PACU  Anesthesia Type:General  Level of Consciousness: awake  Airway and Oxygen Therapy: Patient Spontanous Breathing  Post-op Pain: mild  Post-op Assessment: Post-op Vital signs reviewed, Patient's Cardiovascular Status Stable, Respiratory Function Stable, Patent Airway, No signs of Nausea or vomiting and Pain level controlled  Post-op Vital Signs: Reviewed and stable  Last Vitals:  Filed Vitals:   05/06/14 1410  BP: 120/65  Pulse: 78  Temp: 36.9 C  Resp: 16    Complications: No apparent anesthesia complications

## 2014-05-06 NOTE — Transfer of Care (Signed)
Immediate Anesthesia Transfer of Care Note  Patient: Kimberly Pace  Procedure(s) Performed: Procedure(s): DILATATION AND EVACUATION (N/A)  Patient Location: PACU  Anesthesia Type:General  Level of Consciousness: awake, alert  and oriented  Airway & Oxygen Therapy: Patient Spontanous Breathing and Patient connected to nasal cannula oxygen  Post-op Assessment: Report given to PACU RN and Post -op Vital signs reviewed and stable  Post vital signs: Reviewed and stable  Complications: No apparent anesthesia complications

## 2014-05-06 NOTE — Anesthesia Procedure Notes (Signed)
Procedure Name: Intubation Date/Time: 05/06/2014 11:29 AM Performed by: Donnamae Jude Pre-anesthesia Checklist: Patient identified, Emergency Drugs available, Suction available, Patient being monitored and Timeout performed Patient Re-evaluated:Patient Re-evaluated prior to inductionOxygen Delivery Method: Circle system utilized Preoxygenation: Pre-oxygenation with 100% oxygen Intubation Type: IV induction Ventilation: Mask ventilation without difficulty Laryngoscope Size: Miller and 2 Grade View: Grade I Tube type: Oral Number of attempts: 1 Airway Equipment and Method: Patient positioned with wedge pillow and Stylet Placement Confirmation: ETT inserted through vocal cords under direct vision,  positive ETCO2 and breath sounds checked- equal and bilateral Tube secured with: Tape Dental Injury: Teeth and Oropharynx as per pre-operative assessment

## 2014-05-06 NOTE — Anesthesia Preprocedure Evaluation (Addendum)
Anesthesia Evaluation  Patient identified by MRN, date of birth, ID band Patient awake    Reviewed: Allergy & Precautions, H&P , NPO status , Patient's Chart, lab work & pertinent test results  History of Anesthesia Complications Negative for: history of anesthetic complications  Airway Mallampati: II  TM Distance: >3 FB Neck ROM: Full    Dental  (+) Teeth Intact   Pulmonary neg shortness of breath, neg sleep apnea, neg COPDneg recent URI, Current Smoker, neg PE breath sounds clear to auscultation        Cardiovascular negative cardio ROS  Rhythm:Regular     Neuro/Psych  Headaches, PSYCHIATRIC DISORDERS Anxiety S/p back surgery  Neuromuscular disease    GI/Hepatic GERD-  Poorly Controlled,  Endo/Other  Morbid obesity  Renal/GU negative Renal ROS     Musculoskeletal  (+) Arthritis -, Fibromyalgia -, narcotic dependent  Abdominal   Peds  Hematology negative hematology ROS (+)   Anesthesia Other Findings   Reproductive/Obstetrics (+) Pregnancy                           Anesthesia Physical Anesthesia Plan  ASA: III  Anesthesia Plan: General   Post-op Pain Management:    Induction: Intravenous, Rapid sequence and Cricoid pressure planned  Airway Management Planned: Oral ETT  Additional Equipment: None  Intra-op Plan:   Post-operative Plan: Extubation in OR  Informed Consent: I have reviewed the patients History and Physical, chart, labs and discussed the procedure including the risks, benefits and alternatives for the proposed anesthesia with the patient or authorized representative who has indicated his/her understanding and acceptance.   Dental advisory given  Plan Discussed with: CRNA and Surgeon  Anesthesia Plan Comments:         Anesthesia Quick Evaluation

## 2014-05-06 NOTE — Op Note (Signed)
Margarito Courser  PROCEDURE DATE: 05/06/2014  PREOPERATIVE DIAGNOSIS: 12 week missed abortion.  POSTOPERATIVE DIAGNOSIS: The same.  PROCEDURE:    Suction Dilation and Evacuation.  SURGEON:  Blade Scheff S  ANESTHESIA: Laurie Panda, MD  INDICATIONS: 38 y.o. [id number] MAB at [redacted] weeks gestation, needing surgical completion.  Risks of surgery were discussed with the patient including but not limited to: bleeding which may require transfusion; infection which may require antibiotics; injury to uterus or surrounding organs;need for additional procedures including laparotomy or laparoscopy; possibility of intrauterine scarring which may impair future fertility; and other postoperative/anesthesia complications. Written informed consent was obtained.    FINDINGS:  A 10 wk size anteverted/midline/retroverted uterus, moderate amounts of products of conception, specimen sent to pathology.  ANESTHESIA:    Monitored intravenous sedation, paracervical block.  ESTIMATED BLOOD LOSS:  Less than 20 ml.  SPECIMENS:  Products of conception sent to pathology  COMPLICATIONS:  None immediate.  PROCEDURE DETAILS:  She was then taken to the operating room where general anesthesia was administered and was found to be adequate.  After an adequate timeout was performed, she was placed in the dorsal lithotomy position and examined; then prepped and draped in the sterile manner.   Her bladder was catheterized for an unmeasured amount of clear, yellow urine. A vaginal speculum was then placed in the patient's vagina and a single tooth tenaculum was applied to the anterior lip of the cervix.  A paracervical block using 1% Lidocaine with Epinephrine was administered. The cervix was gently dilated to accommodate a 10 mm suction curette that was gently advanced to the uterine fundus.  The suction device was then activated and curette slowly rotated to clear the uterus of products of conception.  A sharp curettage was then  performed to confirm complete emptying of the uterus.There was  bleeding noted from the tenaculum. Pressure was used at these sites with good hemostasis noted. All insturmnent, needle and lap counts were correct x 2. The patient tolerated the procedure well.  The patient was taken to the recovery area in stable condition.  Mani Celestin S 05/06/2014 11:52 AM

## 2014-05-06 NOTE — MAU Note (Signed)
Bleeding started last night about 1900, had an ultrasound and they told her their was no FHR. Scheduled for a D&C today.

## 2014-05-06 NOTE — H&P (Signed)
Kimberly Pace is an 38 y.o. female. She is a [id number] @ [redacted]w[redacted]d by menstrual hx who was seen at +- Midnight with the dx of missed abortion, with a fetal pole c/w 9+ wk gest age, with absent fetal cardiac activity. The patient was counselled for treatment options and is scheduled for suction D&C this a.m.  Pt was originally scheduled for 9 a.m, will be done as OR is available due to emergency cases . Procedure has been reviewed with the patient, including risks, rationale, alternatives. Pt consents to Suction dilation and curettage.   Past Medical History  Diagnosis Date  . Joint pain   . Fibromyalgia   . Anxiety     no meds since pregnancy  . Headache(784.0)     otc med prn  . Anemia     history with pregnancy  . Degenerative lumbar disc   . Back pain     Past Surgical History  Procedure Laterality Date  . Back surgery    . Cesarean section      x 3  . Wisdom tooth extraction    . Multiple tooth extractions      upper dneures and lower partial    Family History  Problem Relation Age of Onset  . Anesthesia problems Neg Hx   . Asthma Mother   . Hypertension Mother     Social History:  reports that she has been smoking Cigarettes. She has a 7.5 pack-year smoking history. She has never used smokeless tobacco. She reports that she drinks alcohol. She reports that she does not use illicit drugs.  Allergies: No Known Allergies  Prescriptions prior to admission  Medication Sig Dispense Refill Last Dose  . oxyCODONE-acetaminophen (PERCOCET/ROXICET) 5-325 MG per tablet Take 1 tablet by mouth 3 (three) times daily as needed for moderate pain or severe pain. 24 tablet 0 05/05/2014 at 1600  . Prenatal Vit-Fe Fumarate-FA (PRENATAL MULTIVITAMIN) TABS tablet Take 1 tablet by mouth daily at 12 noon.   05/05/2014 at Unknown time  . promethazine (PHENERGAN) 25 MG tablet Take 1  tablet (25 mg total) by mouth every 6 (six) hours as needed for nausea or vomiting. 30 tablet 0 Past Month at Unknown time    ROS: See HPI  Physical Exam: Blood pressure 122/73, pulse 73, temperature 98 F (36.7 C), resp. rate 16 Constitutional: She appears well-developed and well-nourished.  HENT:  Head: Normocephalic.  Eyes: Pupils are equal, round, and reactive to light.  Neck: Normal range of motion.  Cardiovascular: Normal rate and regular rhythm.  Respiratory: Effort normal.  GI: Soft.  Genitourinary:  Moderate amt of tan colored adherent homogenous discharge in vagina No CMT/No adnexal mass or tenderness  Musculoskeletal: Normal range of motion.  Neurological: She is alert and oriented to person, place, and time.  Skin: Skin is warm and dry.    Lab Results     Urinalysis, Routine w reflex microscopic Status: Abnormal   Collection Time: 05/05/14 8:30 PM  Result Value Ref Range   Color, Urine YELLOW YELLOW   APPearance CLEAR CLEAR   Specific Gravity, Urine 1.010 1.005 - 1.030   pH 5.5 5.0 - 8.0   Glucose, UA NEGATIVE NEGATIVE mg/dL   Hgb urine dipstick SMALL (A) NEGATIVE   Bilirubin Urine NEGATIVE NEGATIVE   Ketones, ur NEGATIVE NEGATIVE mg/dL   Protein, ur NEGATIVE NEGATIVE mg/dL   Urobilinogen, UA 0.2 0.0 - 1.0 mg/dL   Nitrite NEGATIVE NEGATIVE   Leukocytes, UA NEGATIVE NEGATIVE  Urine microscopic-add on  Status: Abnormal   Collection Time: 05/05/14 8:30 PM  Result Value Ref Range   Squamous Epithelial / LPF FEW (A) RARE   WBC, UA 0-2 <3 WBC/hpf   RBC / HPF 3-6 <3 RBC/hpf   Bacteria, UA FEW (A) RARE  GC/Chlamydia Probe Amp Status: None   Collection Time: 05/05/14 10:24 PM  Result Value Ref Range   CT Probe RNA NEGATIVE NEGATIVE   GC Probe RNA NEGATIVE NEGATIVE  Wet prep, genital Status: None   Collection Time: 05/05/14 10:24 PM  Result  Value Ref Range   Yeast Wet Prep HPF POC NONE SEEN NONE SEEN   Trich, Wet Prep NONE SEEN NONE SEEN   Clue Cells Wet Prep HPF POC NONE SEEN NONE SEEN   WBC, Wet Prep HPF POC NONE SEEN NONE SEEN  CBC Status: None   Collection Time: 05/05/14 10:35 PM  Result Value Ref Range   WBC 10.3 4.0 - 10.5 K/uL   RBC 4.28 3.87 - 5.11 MIL/uL   Hemoglobin 12.5 12.0 - 15.0 g/dL   HCT 36.7 36.0 - 46.0 %   MCV 85.7 78.0 - 100.0 fL   MCH 29.2 26.0 - 34.0 pg   MCHC 34.1 30.0 - 36.0 g/dL   RDW 13.6 11.5 - 15.5 %   Platelets 237 150 - 400 K/uL  HIV antibody Status: None   Collection Time: 05/05/14 10:35 PM  Result Value Ref Range   HIV 1&2 Ab, 4th Generation NONREACTIVE NONREACTIVE    Result        Blood Type              O Pos   Imaging Results (Last 48 hours)    US Ob Transvaginal  05/05/2014 CLINICAL DATA: Bleeding and cramping tonight about 7 p.m. Estimated gestational age by LMP is 11 weeks 5 days. Quantitative beta HCG is not reported. EXAM: TRANSVAGINAL OB ULTRASOUND TECHNIQUE: Transvaginal ultrasound was performed for complete evaluation of the gestation as well as the maternal uterus, adnexal regions, and pelvic cul-de-sac. COMPARISON: 03/17/2014 FINDINGS: Intrauterine gestational sac: An intrauterine gestational sac is demonstrated. There is a thick septation with the second cystic fluid collection adjacent to the intrauterine gestational sac. This could represent a liquefied subchorionic hemorrhage traverses a second blighted gestational sac. No fetal pole or yolk sac is identified within this smaller fluid collection. If the smaller collection represents a gestational sac, mean sac diameter is 2.1 cm consistent with an estimated gestational age is 7 weeks 0 days. Yolk sac: Yolk sac is identified in the main gestational sac. Embryo: Fetal pole is identified in the main gestational sac. Cardiac Activity: Fetal  cardiac activity is not observed. CRL: 27.8 mm 9 w for d [place] EDC: 12/04/2014 Maternal uterus/adnexae: No myometrial mass lesions identified. A small subchorionic hemorrhage is present. Both ovaries are identified with limited visualization bone appear unremarkable. Minimal free pelvic fluid. IMPRESSION: Intrauterine gestational sac with fetal pole and yolk sac identified. Fetal cardiac activity is not observed with a crown-rump length of 27.8 mm. This meets criteria for intrauterine fetal demise. There is a second cystic structure in the endometrium without fetal pole or yolk sac identified which may indicate an subchorionic hemorrhage or second blighted gestational sac. Electronically Signed By: Lucienne Capers M.D. On: 05/05/2014 23:46     Assessment/Plan: Missed abortion 11w6 d Hx prior c/s x 3. For suction Dilation and curettage. Risks include but are not limited to bleeding, infection, injury to surrounding structures, including bowel, bladder and ureters, blood clots,  and death.  Likelihood of success is high.

## 2014-05-06 NOTE — Discharge Instructions (Signed)
Miscarriage A miscarriage is the sudden loss of an unborn baby (fetus) before the 20th week of pregnancy. Most miscarriages happen in the first 3 months of pregnancy. Sometimes, it happens before a woman even knows she is pregnant. A miscarriage is also called a "spontaneous miscarriage" or "early pregnancy loss." Having a miscarriage can be an emotional experience. Talk with your caregiver about any questions you may have about miscarrying, the grieving process, and your future pregnancy plans. CAUSES   Problems with the fetal chromosomes that make it impossible for the baby to develop normally. Problems with the baby's genes or chromosomes are most often the result of errors that occur, by chance, as the embryo divides and grows. The problems are not inherited from the parents.  Infection of the cervix or uterus.   Hormone problems.   Problems with the cervix, such as having an incompetent cervix. This is when the tissue in the cervix is not strong enough to hold the pregnancy.   Problems with the uterus, such as an abnormally shaped uterus, uterine fibroids, or congenital abnormalities.   Certain medical conditions.   Smoking, drinking alcohol, or taking illegal drugs.   Trauma.  Often, the cause of a miscarriage is unknown.  SYMPTOMS   Vaginal bleeding or spotting, with or without cramps or pain.  Pain or cramping in the abdomen or lower back.  Passing fluid, tissue, or blood clots from the vagina. DIAGNOSIS  Your caregiver will perform a physical exam. You may also have an ultrasound to confirm the miscarriage. Blood or urine tests may also be ordered. TREATMENT   Sometimes, treatment is not necessary if you naturally pass all the fetal tissue that was in the uterus. If some of the fetus or placenta remains in the body (incomplete miscarriage), tissue left behind may become infected and must be removed. Usually, a dilation and curettage (D and C) procedure is performed.  During a D and C procedure, the cervix is widened (dilated) and any remaining fetal or placental tissue is gently removed from the uterus.  Antibiotic medicines are prescribed if there is an infection. Other medicines may be given to reduce the size of the uterus (contract) if there is a lot of bleeding.  If you have Rh negative blood and your baby was Rh positive, you will need a Rh immunoglobulin shot. This shot will protect any future baby from having Rh blood problems in future pregnancies. HOME CARE INSTRUCTIONS   Your caregiver may order bed rest or may allow you to continue light activity. Resume activity as directed by your caregiver.  Have someone help with home and family responsibilities during this time.   Keep track of the number of sanitary pads you use each day and how soaked (saturated) they are. Write down this information.   Do not use tampons. Do not douche or have sexual intercourse until approved by your caregiver.   Only take over-the-counter or prescription medicines for pain or discomfort as directed by your caregiver.   Do not take aspirin. Aspirin can cause bleeding.   Keep all follow-up appointments with your caregiver.   If you or your partner have problems with grieving, talk to your caregiver or seek counseling to help cope with the pregnancy loss. Allow enough time to grieve before trying to get pregnant again.  SEEK IMMEDIATE MEDICAL CARE IF:   You have severe cramps or pain in your back or abdomen.  You have a fever.  You pass large blood clots (walnut-sized   or larger) ortissue from your vagina. Save any tissue for your caregiver to inspect.   Your bleeding increases.   You have a thick, bad-smelling vaginal discharge.  You become lightheaded, weak, or you faint.   You have chills.  MAKE SURE YOU:  Understand these instructions.  Will watch your condition.  Will get help right away if you are not doing well or get  worse. Document Released: 11/05/2000 Document Revised: 09/06/2012 Document Reviewed: 07/01/2011 [employer] Patient Information 2015 [employer]. This information is not intended to replace advice given to you by your health care provider. Make sure you discuss any questions you have with your health care provider.  

## 2014-05-08 ENCOUNTER — Encounter (HOSPITAL_COMMUNITY): Payer: Self-pay | Admitting: Family Medicine

## 2014-05-09 ENCOUNTER — Encounter (HOSPITAL_COMMUNITY): Payer: Self-pay | Admitting: *Deleted

## 2014-05-09 ENCOUNTER — Inpatient Hospital Stay (HOSPITAL_COMMUNITY)
Admission: AD | Admit: 2014-05-09 | Discharge: 2014-05-09 | Discharge status: Against Medical Advice | Payer: Medicaid Other | Source: Ambulatory Visit | Attending: Obstetrics & Gynecology | Admitting: Obstetrics & Gynecology

## 2014-05-09 DIAGNOSIS — F17210 Nicotine dependence, cigarettes, uncomplicated: Secondary | ICD-10-CM | POA: Diagnosis not present

## 2014-05-09 DIAGNOSIS — R109 Unspecified abdominal pain: Secondary | ICD-10-CM | POA: Insufficient documentation

## 2014-05-09 DIAGNOSIS — M797 Fibromyalgia: Secondary | ICD-10-CM | POA: Insufficient documentation

## 2014-05-09 NOTE — MAU Provider Note (Signed)
History     CSN: [id number]  Arrival date and time: 05/09/14 1701   First Provider Initiated Contact with Patient 05/09/14 1733      No chief complaint on file.  HPI  38 y.o. presents for lower abd pain s/p D&C 3d ago. This was for a 12 week Missed abortion. Pt reports vaginal bleeding, cramping and pain has gradually decreased over last 3d but is unmanageable on Rx Ibuprofren. Pt denies n/v, fever/chills, vaginal discharge, dysuria, hematuria, flank pain or back pain. Pt reports lower abd pain is cramping and aching. Requests oxycodone and percocet by name. Pt denies soaking a pad, reports feeling generalized leg weakness, denies blurred vision, dizziness. Denies recent drug/alcohol use.    OB History    Gravida Para Term Preterm AB TAB SAB Ectopic Multiple Living   5 3 3  0 1 0 0 1 0 2      Obstetric Comments   Last pregnancy [facility] heartbeat at [facility]      Past Medical History  Diagnosis Date  . Joint pain   . Fibromyalgia   . Anxiety     no meds since pregnancy  . Headache(784.0)     otc med prn  . Anemia     history with pregnancy  . Degenerative lumbar disc   . Back pain     Past Surgical History  Procedure Laterality Date  . Back surgery    . Cesarean section      x 3  . Wisdom tooth extraction    . Multiple tooth extractions      upper dneures and lower partial  . Dilation and evacuation N/A 05/06/2014    Procedure: DILATATION AND EVACUATION;  Surgeon: Donnamae Jude, MD;  Location: [facility];  Service: Gynecology;  Laterality: N/A;    Family History  Problem Relation Age of Onset  . Anesthesia problems Neg Hx   . Asthma Mother   . Hypertension Mother     History  Substance Use Topics  . Smoking status: Light Tobacco Smoker -- 0.50 packs/day for 15 years    Types: Cigarettes  . Smokeless tobacco: Never Used  . Alcohol Use: No     Comment: socially but none with pregnancy.      Allergies: No Known Allergies  Prescriptions prior to admission   Medication Sig Dispense Refill Last Dose  . ibuprofen (ADVIL,MOTRIN) 600 MG tablet Take 1 tablet (600 mg total) by mouth every 6 (six) hours as needed. 30 tablet 1 05/09/2014 at 1300  . oxyCODONE-acetaminophen (PERCOCET/ROXICET) 5-325 MG per tablet Take 1 tablet by mouth 3 (three) times daily as needed for moderate pain or severe pain. 24 tablet 0 Unknown at Unknown time    Review of Systems  Constitutional: Positive for malaise/fatigue. Negative for fever, chills and weight loss.  Eyes: Negative for blurred vision.  Respiratory: Negative for shortness of breath.   Cardiovascular: Negative for chest pain.  Gastrointestinal: Positive for abdominal pain. Negative for nausea, vomiting, diarrhea and constipation.  Genitourinary: Negative for dysuria, urgency and frequency.  Neurological: Positive for weakness. Negative for dizziness, tremors and headaches.   Physical Exam   Blood pressure 127/61, pulse 80, temperature 97.6 F (36.4 C), temperature source Oral, resp. rate 16, height 5' (1.524 m), weight 207 lb (93.895 kg), last menstrual period 02/12/2014, SpO2 100 %, unknown if currently breastfeeding.  Physical Exam  Constitutional: She appears well-developed.  HENT:  bil pupils +2mm sluggish reaction.   Cardiovascular: Normal rate, normal heart sounds and  intact distal pulses.   Respiratory: Effort normal and breath sounds normal. No respiratory distress.  GI: Soft. Bowel sounds are normal. She exhibits no distension and no mass. There is tenderness. There is no rebound and no guarding.  Bil lower quadrant tender to palpation  Genitourinary: Vagina normal.  Uterus tender to palpation, no CMT on bimanual exam.  Musculoskeletal: She exhibits no edema.  Skin: Skin is warm and dry.  Psychiatric:  Pt intermittently tearful, staggering wide gait   Small bleeding observed on exam. No hemorrhage MAU Course  Procedures  MDM CBC ordered to eval infection and anemia for report of  weakness. UA, Utox eval possible UTI and drug use for narcotic seeking behavior.  Discussed with MD Anyanwu, directed not to give or prescribe narcotics to this patient due to patient's history of narcotic use and reported pain disproportional to procedure. Order IM Toradol for pain management.  Assessment and Plan  Pt became tearful and belligerent with staff when educated that she would not be receiving narcotic pain medication. Pt refused lab draw and toradol. Pt staggered with wide gait out of room, refuses additional treatment and evaluation. Pt left AMA.   Hansel Feinstein 05/09/2014, 6:10 PM   Seen also by me Tox screen sent for verification. Seabron Spates, CNM

## 2014-05-09 NOTE — MAU Note (Signed)
Urine in lab 

## 2014-05-09 NOTE — MAU Note (Signed)
Pt. Left AMA without notifying staff.

## 2014-05-09 NOTE — Progress Notes (Signed)
Pt called the front desk and informed me that she just had a D&C and she is having severe pain and was only given ibuprofen.  I advised to please go to MAU so that she can be evaluated for severe pain.  Pt stated that she would try and get to MAU.

## 2014-05-09 NOTE — MAU Note (Signed)
Pt. Had d and c done on Saturday. Has follow up scheduled for Jun 05, 2014. Pt. States the day after and yesterday pain was very bad and uncontrolled. Pt. States started to cramp yesterday that is sharp. Pt. States she was given Rx 600mg  motrin and last took this at 1300. Also, states she has been taking motrin pm to help her sleep. Scant amount of bleeding noted. States she has nausea associated with pain. Denies vomiting. Small amount of diarrhea.

## 2014-05-17 ENCOUNTER — Ambulatory Visit (HOSPITAL_COMMUNITY): Admission: RE | Admit: 2014-05-17 | Payer: Medicaid Other | Source: Ambulatory Visit

## 2014-06-05 ENCOUNTER — Ambulatory Visit: Payer: Medicaid Other | Admitting: Obstetrics & Gynecology

## 2014-06-08 ENCOUNTER — Encounter (HOSPITAL_COMMUNITY): Payer: Self-pay | Admitting: Obstetrics and Gynecology

## 2014-11-02 ENCOUNTER — Encounter (HOSPITAL_COMMUNITY): Payer: Self-pay | Admitting: Obstetrics and Gynecology

## 2015-01-25 ENCOUNTER — Encounter (HOSPITAL_COMMUNITY): Payer: Self-pay | Admitting: Emergency Medicine

## 2015-01-25 ENCOUNTER — Emergency Department (HOSPITAL_COMMUNITY)
Admission: EM | Admit: 2015-01-25 | Discharge: 2015-01-25 | Disposition: A | Discharge status: Home / Self Care | Payer: Medicaid Other | Attending: Emergency Medicine | Admitting: Emergency Medicine

## 2015-01-25 DIAGNOSIS — Z8659 Personal history of other mental and behavioral disorders: Secondary | ICD-10-CM | POA: Diagnosis not present

## 2015-01-25 DIAGNOSIS — I10 Essential (primary) hypertension: Secondary | ICD-10-CM | POA: Insufficient documentation

## 2015-01-25 DIAGNOSIS — Z720 Tobacco use: Secondary | ICD-10-CM | POA: Diagnosis not present

## 2015-01-25 DIAGNOSIS — Z862 Personal history of diseases of the blood and blood-forming organs and certain disorders involving the immune mechanism: Secondary | ICD-10-CM | POA: Diagnosis not present

## 2015-01-25 DIAGNOSIS — Z8739 Personal history of other diseases of the musculoskeletal system and connective tissue: Secondary | ICD-10-CM | POA: Diagnosis not present

## 2015-01-25 DIAGNOSIS — H9203 Otalgia, bilateral: Secondary | ICD-10-CM | POA: Insufficient documentation

## 2015-01-25 HISTORY — DX: Essential (primary) hypertension: I10

## 2015-01-25 MED ORDER — PSEUDOEPHEDRINE HCL 60 MG PO TABS: 60.0000 mg | ORAL_TABLET | Freq: Four times a day (QID) | ORAL | Status: DC | PRN

## 2015-01-25 MED ORDER — CARBAMIDE PEROXIDE 6.5 % OT SOLN: 5.0000 [drp] | Freq: Two times a day (BID) | OTIC | Status: DC

## 2015-01-25 NOTE — ED Notes (Signed)
Bilateral ear pain x 1 week.  Drainage from L ear.   Patient denies other symptoms.

## 2015-01-25 NOTE — Discharge Instructions (Signed)
Read the information below.  Use the prescribed medication as directed.  Please discuss all new medications with your pharmacist.  You may return to the Emergency Department at any time for worsening condition or any new symptoms that concern you.  If you develop fevers, uncontrolled ear pain, bleeding or discharge from your ear, see your doctor or return for a recheck.     Otalgia The most common reason for this in children is an infection of the middle ear. Pain from the middle ear is usually caused by a build-up of fluid and pressure behind the eardrum. Pain from an earache can be sharp, dull, or burning. The pain may be temporary or constant. The middle ear is connected to the nasal passages by a short narrow tube called the Eustachian tube. The Eustachian tube allows fluid to drain out of the middle ear, and helps keep the pressure in your ear equalized. CAUSES  A cold or allergy can block the Eustachian tube with inflammation and the build-up of secretions. This is especially likely in small children, because their Eustachian tube is shorter and more horizontal. When the Eustachian tube closes, the normal flow of fluid from the middle ear is stopped. Fluid can accumulate and cause stuffiness, pain, hearing loss, and an ear infection if germs start growing in this area. SYMPTOMS  The symptoms of an ear infection may include fever, ear pain, fussiness, increased crying, and irritability. Many children will have temporary and minor hearing loss during and right after an ear infection. Permanent hearing loss is rare, but the risk increases the more infections a child has. Other causes of ear pain include retained water in the outer ear canal from swimming and bathing. Ear pain in adults is less likely to be from an ear infection. Ear pain may be referred from other locations. Referred pain may be from the joint between your jaw and the skull. It may also come from a tooth problem or problems in the neck.  Other causes of ear pain include:  A foreign body in the ear.  Outer ear infection.  Sinus infections.  Impacted ear wax.  Ear injury.  Arthritis of the jaw or TMJ problems.  Middle ear infection.  Tooth infections.  Sore throat with pain to the ears. DIAGNOSIS  Your caregiver can usually make the diagnosis by examining you. Sometimes other special studies, including x-rays and lab work may be necessary. TREATMENT   If antibiotics were prescribed, use them as directed and finish them even if you or your child's symptoms seem to be improved.  Sometimes PE tubes are needed in children. These are little plastic tubes which are put into the eardrum during a simple surgical procedure. They allow fluid to drain easier and allow the pressure in the middle ear to equalize. This helps relieve the ear pain caused by pressure changes. HOME CARE INSTRUCTIONS   Only take over-the-counter or prescription medicines for pain, discomfort, or fever as directed by your caregiver. DO NOT GIVE CHILDREN ASPIRIN because of the association of Reye's Syndrome in children taking aspirin.  Use a cold pack applied to the outer ear for 15-20 minutes, 03-04 times per day or as needed may reduce pain. Do not apply ice directly to the skin. You may cause frost bite.  Over-the-counter ear drops used as directed may be effective. Your caregiver may sometimes prescribe ear drops.  Resting in an upright position may help reduce pressure in the middle ear and relieve pain.  Ear pain  caused by rapidly descending from high altitudes can be relieved by swallowing or chewing gum. Allowing infants to suck on a bottle during airplane travel can help.  Do not smoke in the house or near children. If you are unable to quit smoking, smoke outside.  Control allergies. SEEK IMMEDIATE MEDICAL CARE IF:   You or your child are becoming sicker.  Pain or fever relief is not obtained with medicine.  You or your child's  symptoms (pain, fever, or irritability) do not improve within 24 to 48 hours or as instructed.  Severe pain suddenly stops hurting. This may indicate a ruptured eardrum.  You or your children develop new problems such as severe headaches, stiff neck, difficulty swallowing, or swelling of the face or around the ear. Document Released: 12/28/2003 Document Revised: 08/04/2011 Document Reviewed: 05/03/2008 [facility] Patient Information 2015 [place], [place]. This information is not intended to replace advice given to you by your health care provider. Make sure you discuss any questions you have with your health care provider.

## 2015-01-25 NOTE — ED Provider Notes (Signed)
CSN: [id number]     Arrival date & time 01/25/15  0940 History  This chart was scribed for non-physician practitioner Clayton Bibles, PA-C, working with Lacretia Leigh, MD, by Eustaquio Maize, ED Scribe. This patient was seen in room TR06C/TR06C and the patient's care was started at 10:29 AM.  Chief Complaint  Patient presents with  . Otalgia   The history is provided by the patient. No language interpreter was used.     HPI Comments: Kimberly Pace is a 39 y.o. female who presents to the Emergency Department complaining of sudden onset bilateral ear pain, right greater than left x 1 week. Pt describes the pain as burning, stabbing, and shooting in sensation. She also complains of drainage from left ear. She has taken Ibuprofen and applied warm compresses without relief. Pt denies recent travel or swimming. No recent changes in shampoo, soaps, detergents, or body wash. She notes that her daughter began complaining of ear pain 1 day ago. Denies rhinorrhea, nasal congestion, sore throat, fever, dental pain, or any other associated symptoms. Pt is current every day smoker. Pt has hx of fibromyalgia but notes she has not been to her doctor in the past 7 years. She has been managing the pain on her own.   Past Medical History  Diagnosis Date  . Joint pain   . Fibromyalgia   . Anxiety     no meds since pregnancy  . Headache(784.0)     otc med prn  . Anemia     history with pregnancy  . Degenerative lumbar disc   . Back pain   . Hypertension    Past Surgical History  Procedure Laterality Date  . Back surgery    . Cesarean section      x 3  . Wisdom tooth extraction    . Multiple tooth extractions      upper dneures and lower partial  . Dilation and evacuation N/A 05/06/2014    Procedure: DILATATION AND EVACUATION;  Surgeon: Donnamae Jude, MD;  Location: [facility];  Service: Gynecology;  Laterality: N/A;   Family History  Problem Relation Age of Onset  . Anesthesia problems Neg Hx   . Asthma  Mother   . Hypertension Mother    Social History  Substance Use Topics  . Smoking status: Light Tobacco Smoker -- 0.50 packs/day for 15 years    Types: Cigarettes  . Smokeless tobacco: Never Used  . Alcohol Use: No     Comment: socially but none with pregnancy.     OB History    Gravida Para Term Preterm AB TAB SAB Ectopic Multiple Living   5 3 3  0 1 0 0 1 0 2      Obstetric Comments   Last pregnancy no heartbeat at 9weeks     Review of Systems  Constitutional: Negative for fever.  HENT: Positive for ear discharge (Left) and ear pain (Bilaterally). Negative for congestion, dental problem, rhinorrhea, sore throat and trouble swallowing.   Respiratory: Negative for cough and shortness of breath.   Musculoskeletal: Negative for neck pain and neck stiffness.  Skin: Negative for rash and wound.  Allergic/Immunologic: Negative for immunocompromised state.  Hematological: Does not bruise/bleed easily.  Psychiatric/Behavioral: Negative for self-injury.   Allergies  Review of patient's allergies indicates no known allergies.  Home Medications   Prior to Admission medications   Medication Sig Start Date End Date Taking? Authorizing Provider  ibuprofen (ADVIL,MOTRIN) 600 MG tablet Take 1 tablet (600 mg total) by  mouth every 6 (six) hours as needed. 05/06/14   Donnamae Jude, MD  oxyCODONE-acetaminophen (PERCOCET/ROXICET) 5-325 MG per tablet Take 1 tablet by mouth 3 (three) times daily as needed for moderate pain or severe pain. 04/10/14   [name], PA-C   Triage Vitals: BP 150/95 mmHg  Pulse 83  Temp(Src) 98.4 F (36.9 C) (Oral)  Resp 18  SpO2 100%  LMP 01/07/2015   Physical Exam  Constitutional: She appears well-developed and well-nourished. No distress.  HENT:  Head: Normocephalic and atraumatic.  Right Ear: Tympanic membrane, external ear and ear canal normal.  Left Ear: Tympanic membrane, external ear and ear canal normal.  Mouth/Throat: Oropharynx is clear  and moist. No oropharyngeal exudate.  Mild tenderness with pressure on right tragus  Eyes: Conjunctivae and EOM are normal.  Neck: Normal range of motion. Neck supple.  Cardiovascular: Normal rate and regular rhythm.   Pulmonary/Chest: Effort normal and breath sounds normal. No respiratory distress. She has no wheezes. She has no rales.  Neurological: She is alert.  Skin: She is not diaphoretic.  Nursing note and vitals reviewed.   ED Course  Procedures (including critical care time)  DIAGNOSTIC STUDIES: Oxygen Saturation is 100% on RA, normal by my interpretation.    COORDINATION OF CARE: 10:38 AM-Discussed treatment plan which includes referral to HENT with pt at bedside and pt agreed to plan.   Labs Review Labs Reviewed - No data to display  Imaging Review No results found. I have personally reviewed and evaluated these images and lab results as part of my medical decision-making.   EKG Interpretation None      MDM   Final diagnoses:  Otalgia, bilateral   Afebrile, nontoxic patient with bilateral ear pain.  Ear exam is normal bilaterally.  No other URI symptoms.  Pt does have fibromyalgia and also smokes cigarettes.  Possible eustacian tube dysfunction?  Auralgan now off the market, discussed with ED pharmacist the next best option to treat ear pain - she recommended Debrox.   D/C home with debrox, sudafed, PCP follow up.  Discussed result, findings, treatment, and follow up  with patient.  Pt given return precautions.  Pt verbalizes understanding and agrees with plan.       I personally performed the services described in this documentation, which was scribed in my presence. The recorded information has been reviewed and is accurate.     Clayton Bibles, PA-C 01/25/15 1137  Lacretia Leigh, MD 01/26/15 [id number]

## 2015-01-29 IMAGING — US US OB COMP LESS 14 WK
1 series · 13 of 28 positions shown · non-contrast
Comparison: None.

CLINICAL DATA: Pelvic pain and cramping. Gestational age by LMP of
6 weeks 1 day.

EXAM:
OBSTETRIC <14 WK US AND TRANSVAGINAL OB US
TECHNIQUE: Both transabdominal and transvaginal ultrasound examinations were
performed for complete evaluation of the gestation as well as the
maternal uterus, adnexal regions, and pelvic cul-de-sac.
Transvaginal technique was performed to assess early pregnancy.

[Series 1: us ob comp less 14 wks · 44 acquisitions, 13 frames shown]
[im 2/44]
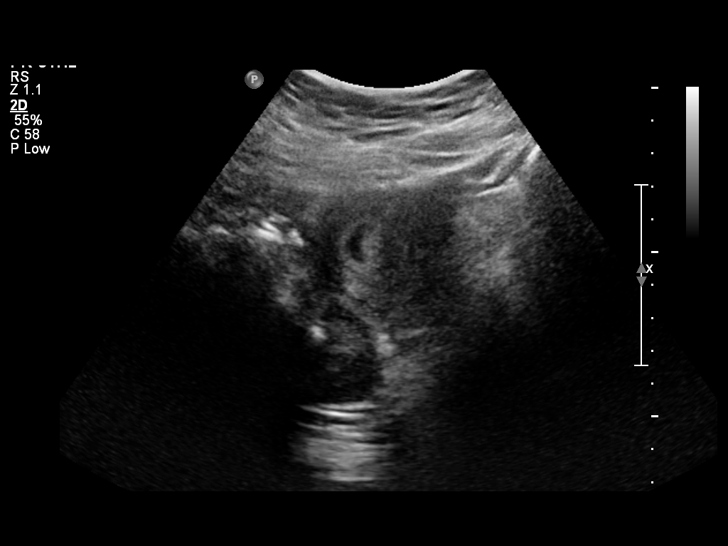
[im 5/44]
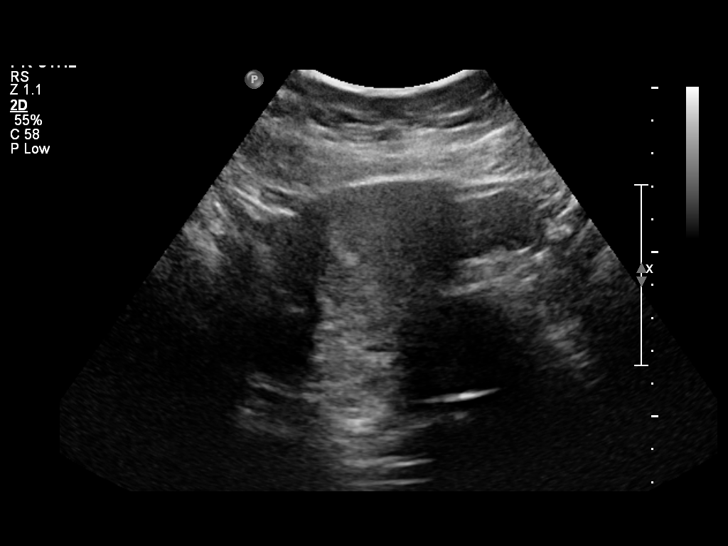
[im 8/44]
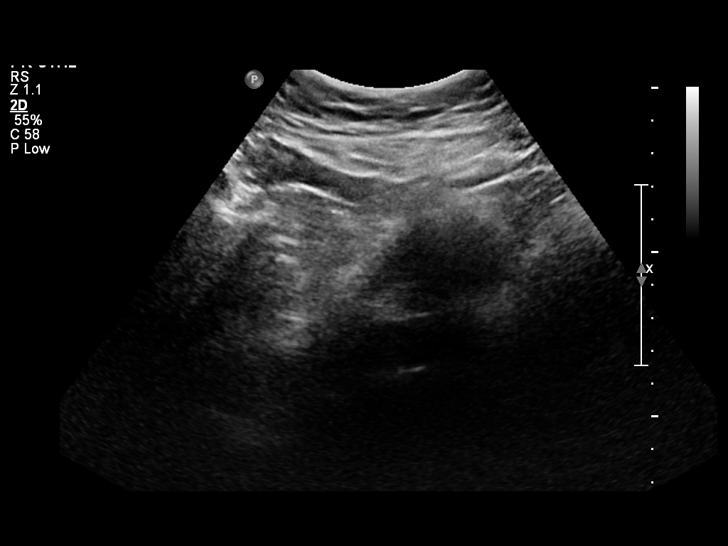
[im 12/44]
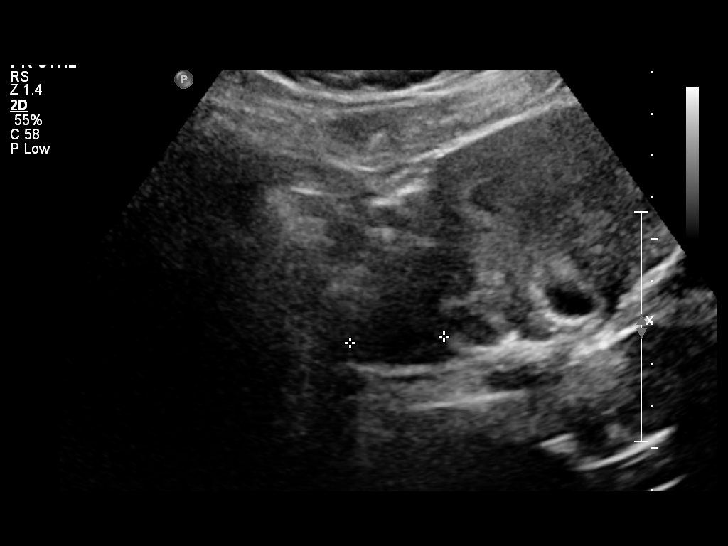
[im 15/44]
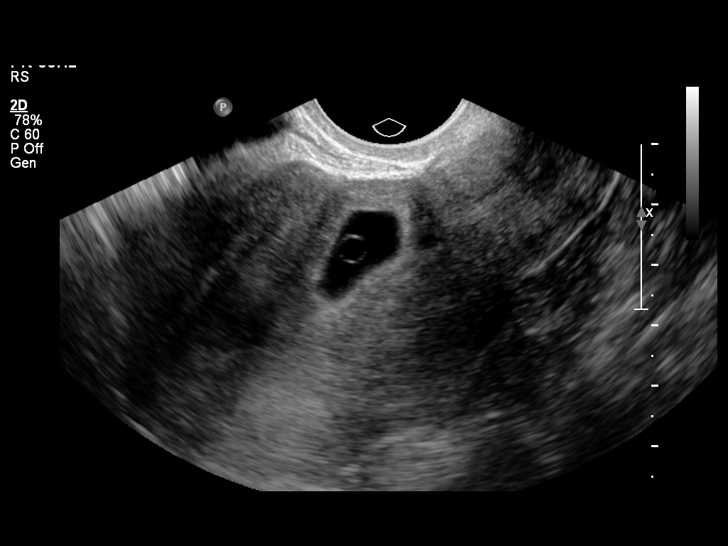
[im 18/44]
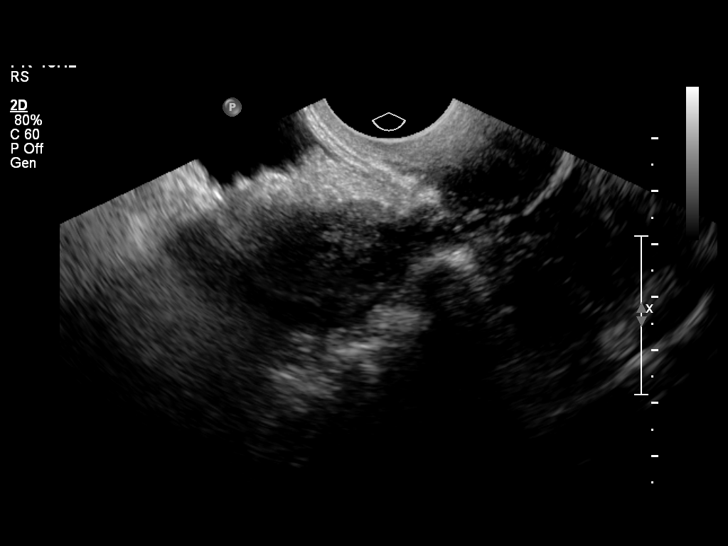
[im 23/44]
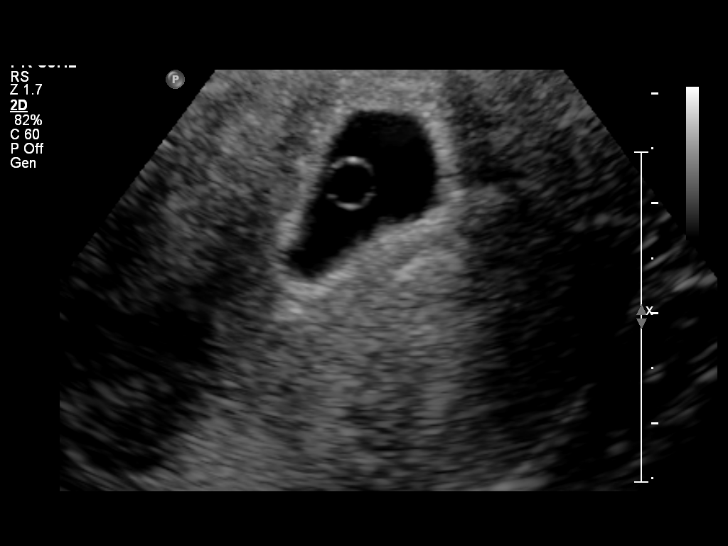
[im 26/44]
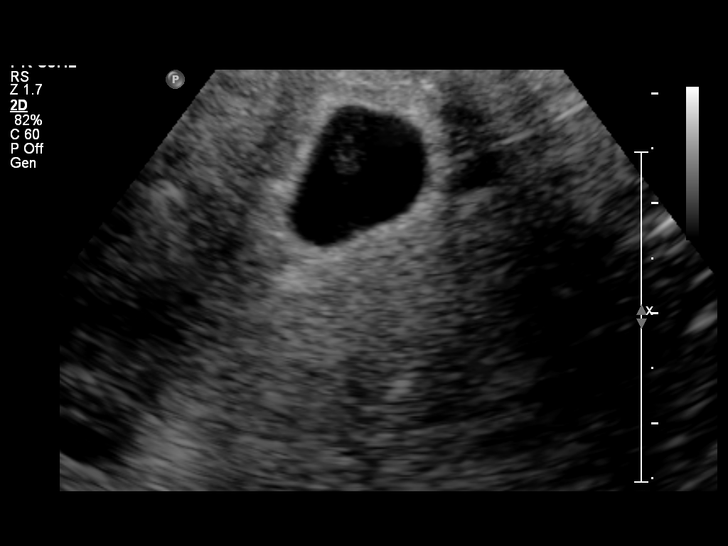
[im 29/44]
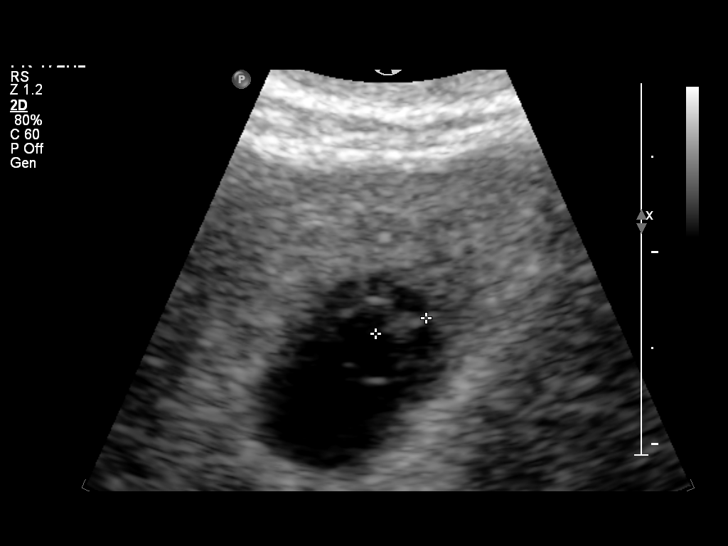
[im 32/44]
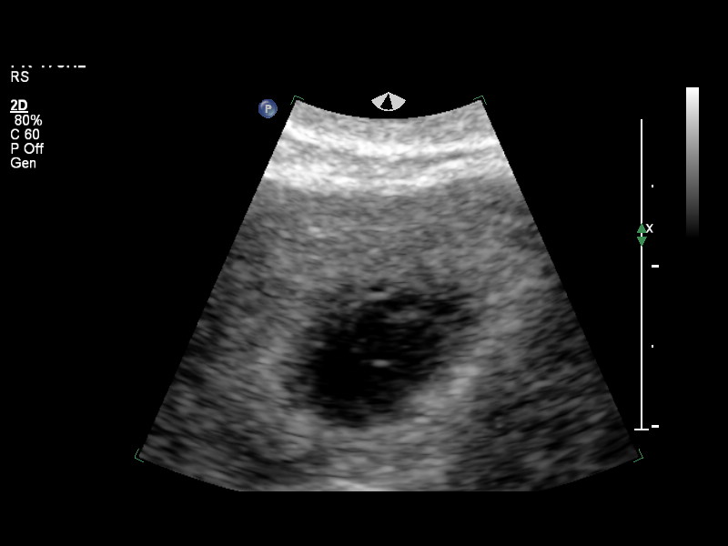
[im 36/44]
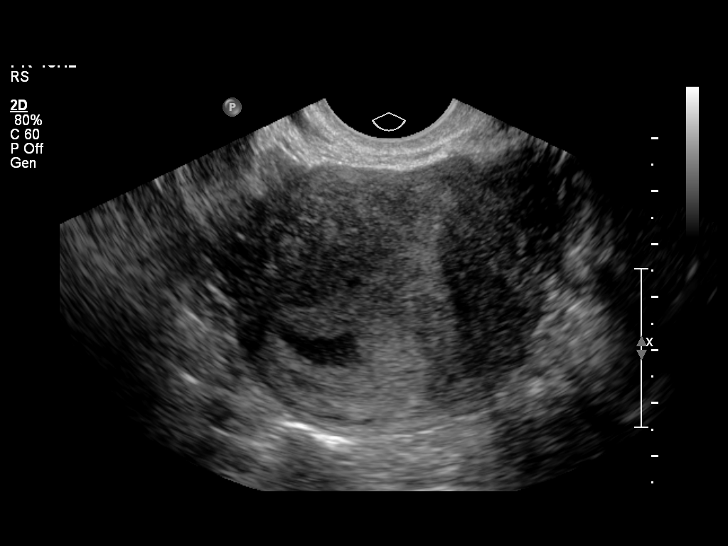
[im 39/44]
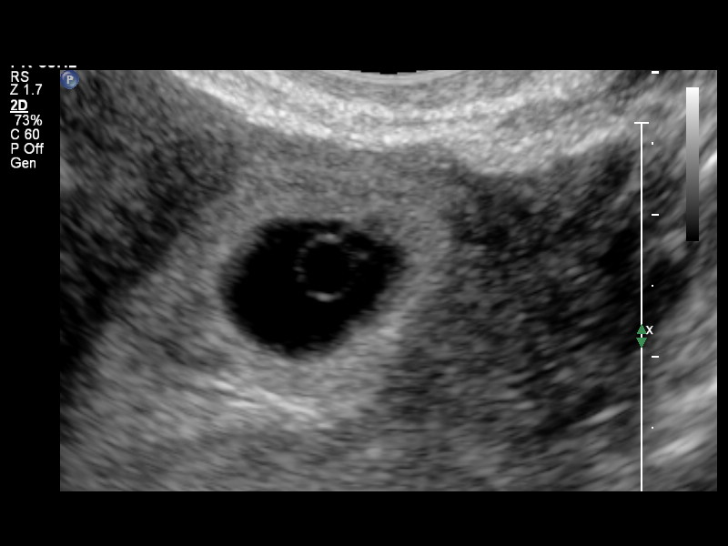
[im 42/44]
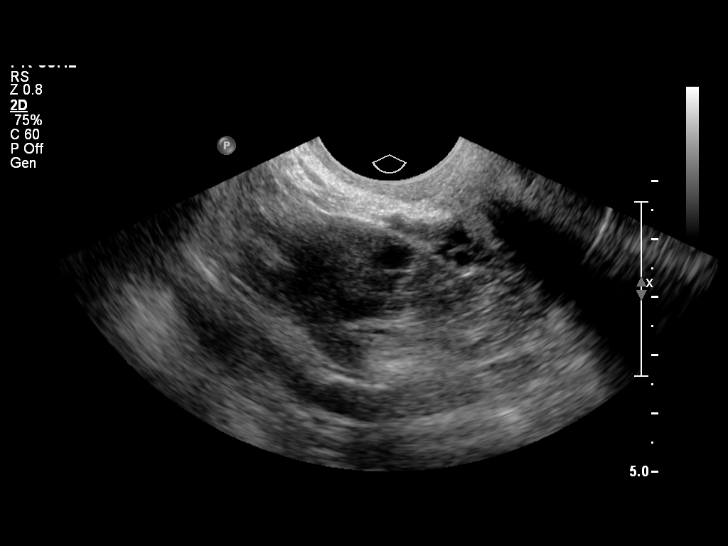

[13 of 28 positions shown; findings below may reference images not displayed]

FINDINGS: Intrauterine gestational sac: On ectopic gestational sac is seen
within the C-section scar in the myometrium of the anterior lower
uterine segment. The gestational sac is not located within the
normal endometrial cavity.

Yolk sac:  Visualized

Embryo:  Visualized

Cardiac Activity: Visualized

Heart Rate:  92 bpm

CRL:   3  mm   5 w 6 d                  US EDC: 02/10/2014

Maternal uterus/adnexae: Both ovaries are normal in appearance. No
adnexal mass or free fluid identified.
IMPRESSION: Living ectopic pregnancy within previous C-section scar, as
described above. No evidence of hemoperitoneum.

Critical Value/emergent results were called by telephone at the time
of interpretation on 06/16/2013 at [DATE] to the patient's nurse
[REDACTED] Morgen in [facility], who verbally acknowledged these results.

## 2015-08-31 ENCOUNTER — Emergency Department (HOSPITAL_COMMUNITY)
Admission: EM | Admit: 2015-08-31 | Discharge: 2015-08-31 | Disposition: A | Discharge status: Home / Self Care | Payer: Medicaid Other | Attending: Emergency Medicine | Admitting: Emergency Medicine

## 2015-08-31 ENCOUNTER — Encounter (HOSPITAL_COMMUNITY): Payer: Self-pay | Admitting: *Deleted

## 2015-08-31 ENCOUNTER — Emergency Department (HOSPITAL_COMMUNITY): Payer: Medicaid Other

## 2015-08-31 DIAGNOSIS — O9A211 Injury, poisoning and certain other consequences of external causes complicating pregnancy, first trimester: Secondary | ICD-10-CM | POA: Insufficient documentation

## 2015-08-31 DIAGNOSIS — Y9389 Activity, other specified: Secondary | ICD-10-CM | POA: Insufficient documentation

## 2015-08-31 DIAGNOSIS — Z3A01 Less than 8 weeks gestation of pregnancy: Secondary | ICD-10-CM | POA: Diagnosis not present

## 2015-08-31 DIAGNOSIS — Z862 Personal history of diseases of the blood and blood-forming organs and certain disorders involving the immune mechanism: Secondary | ICD-10-CM | POA: Insufficient documentation

## 2015-08-31 DIAGNOSIS — W010XXA Fall on same level from slipping, tripping and stumbling without subsequent striking against object, initial encounter: Secondary | ICD-10-CM | POA: Diagnosis not present

## 2015-08-31 DIAGNOSIS — Y9289 Other specified places as the place of occurrence of the external cause: Secondary | ICD-10-CM | POA: Diagnosis not present

## 2015-08-31 DIAGNOSIS — S4991XA Unspecified injury of right shoulder and upper arm, initial encounter: Secondary | ICD-10-CM | POA: Insufficient documentation

## 2015-08-31 DIAGNOSIS — O99331 Smoking (tobacco) complicating pregnancy, first trimester: Secondary | ICD-10-CM | POA: Diagnosis not present

## 2015-08-31 DIAGNOSIS — M25511 Pain in right shoulder: Secondary | ICD-10-CM

## 2015-08-31 DIAGNOSIS — Z8659 Personal history of other mental and behavioral disorders: Secondary | ICD-10-CM | POA: Diagnosis not present

## 2015-08-31 DIAGNOSIS — Z8739 Personal history of other diseases of the musculoskeletal system and connective tissue: Secondary | ICD-10-CM | POA: Insufficient documentation

## 2015-08-31 DIAGNOSIS — Y998 Other external cause status: Secondary | ICD-10-CM | POA: Diagnosis not present

## 2015-08-31 DIAGNOSIS — F17210 Nicotine dependence, cigarettes, uncomplicated: Secondary | ICD-10-CM | POA: Diagnosis not present

## 2015-08-31 DIAGNOSIS — Z3490 Encounter for supervision of normal pregnancy, unspecified, unspecified trimester: Secondary | ICD-10-CM

## 2015-08-31 DIAGNOSIS — O10011 Pre-existing essential hypertension complicating pregnancy, first trimester: Secondary | ICD-10-CM | POA: Diagnosis not present

## 2015-08-31 LAB — HCG, QUANTITATIVE, PREGNANCY: HCG, BETA CHAIN, QUANT, S: 95868 m[IU]/mL — AB (ref <5)

## 2015-08-31 LAB — POC URINE PREG, ED: Preg Test, Ur: POSITIVE — AB

## 2015-08-31 MED ORDER — GOODSENSE PRENATAL VITAMINS 28-0.8 MG PO TABS: 1.0000 | ORAL_TABLET | Freq: Every day | ORAL | Status: AC

## 2015-08-31 MED ORDER — ACETAMINOPHEN 500 MG PO TABS
1000.0000 mg | ORAL_TABLET | Freq: Once | ORAL | Status: AC
  Administered 2015-08-31: 1000 mg via ORAL
  Filled 2015-08-31: qty 2

## 2015-08-31 NOTE — ED Notes (Signed)
Pt states she tripped over bin on floor and landed on R shoulder last night.  States previous injury to same shoulder.  No relief with lidocaine patches.  Pt diaphoretic, pulses strong, no new numbness/tingling to R hand.

## 2015-08-31 NOTE — ED Notes (Signed)
Pt in xray

## 2015-08-31 NOTE — Discharge Instructions (Signed)
Shoulder Pain The shoulder is the joint that connects your arms to your body. The bones that form the shoulder joint include the upper arm bone (humerus), the shoulder blade (scapula), and the collarbone (clavicle). The top of the humerus is shaped like a ball and fits into a rather flat socket on the scapula (glenoid cavity). A combination of muscles and strong, fibrous tissues that connect muscles to bones (tendons) support your shoulder joint and hold the ball in the socket. Small, fluid-filled sacs (bursae) are located in different areas of the joint. They act as cushions between the bones and the overlying soft tissues and help reduce friction between the gliding tendons and the bone as you move your arm. Your shoulder joint allows a wide range of motion in your arm. This range of motion allows you to do things like scratch your back or throw a ball. However, this range of motion also makes your shoulder more prone to pain from overuse and injury. Causes of shoulder pain can originate from both injury and overuse and usually can be grouped in the following four categories:  Redness, swelling, and pain (inflammation) of the tendon (tendinitis) or the bursae (bursitis).  Instability, such as a dislocation of the joint.  Inflammation of the joint (arthritis).  Broken bone (fracture). HOME CARE INSTRUCTIONS   Apply ice to the sore area.  Put ice in a plastic bag.  Place a towel between your skin and the bag.  Leave the ice on for 15-20 minutes, 3-4 times per day for the first 2 days, or as directed by your health care provider.  Stop using cold packs if they do not help with the pain.  If you have a shoulder sling or immobilizer, wear it as long as your caregiver instructs. Only remove it to shower or bathe. Move your arm as little as possible, but keep your hand moving to prevent swelling.  Squeeze a soft ball or foam pad as much as possible to help prevent swelling.  Only take  over-the-counter or prescription medicines for pain, discomfort, or fever as directed by your caregiver. SEEK MEDICAL CARE IF:   Your shoulder pain increases, or new pain develops in your arm, hand, or fingers.  Your hand or fingers become cold and numb.  Your pain is not relieved with medicines. SEEK IMMEDIATE MEDICAL CARE IF:   Your arm, hand, or fingers are numb or tingling.  Your arm, hand, or fingers are significantly swollen or turn white or blue. MAKE SURE YOU:   Understand these instructions.  Will watch your condition.  Will get help right away if you are not doing well or get worse.   This information is not intended to replace advice given to you by your health care provider. Make sure you discuss any questions you have with your health care provider.   Document Released: 02/19/2005 Document Revised: 06/02/2014 Document Reviewed: 09/04/2014 Elsevier Interactive Patient Education 2016 [employer]  Musculoskeletal Pain Musculoskeletal pain is muscle and boney aches and pains. These pains can occur in any part of the body. Your caregiver may treat you without knowing the cause of the pain. They may treat you if blood or urine tests, X-rays, and other tests were normal.  CAUSES There is often not a definite cause or reason for these pains. These pains may be caused by a type of germ (virus). The discomfort may also come from overuse. Overuse includes working out too hard when your body is not fit. Boney aches  also come from weather changes. Bone is sensitive to atmospheric pressure changes. HOME CARE INSTRUCTIONS   Ask when your test results will be ready. Make sure you get your test results.  Only take over-the-counter or prescription medicines for pain, discomfort, or fever as directed by your caregiver. If you were given medications for your condition, do not drive, operate machinery or power tools, or sign legal documents for 24 hours. Do not drink alcohol. Do not take  sleeping pills or other medications that may interfere with treatment.  Continue all activities unless the activities cause more pain. When the pain lessens, slowly resume normal activities. Gradually increase the intensity and duration of the activities or exercise.  During periods of severe pain, bed rest may be helpful. Lay or sit in any position that is comfortable.  Putting ice on the injured area.  Put ice in a bag.  Place a towel between your skin and the bag.  Leave the ice on for 15 to 20 minutes, 3 to 4 times a day.  Follow up with your caregiver for continued problems and no reason can be found for the pain. If the pain becomes worse or does not go away, it may be necessary to repeat tests or do additional testing. Your caregiver may need to look further for a possible cause. SEEK IMMEDIATE MEDICAL CARE IF:  You have pain that is getting worse and is not relieved by medications.  You develop chest pain that is associated with shortness or breath, sweating, feeling sick to your stomach (nauseous), or throw up (vomit).  Your pain becomes localized to the abdomen.  You develop any new symptoms that seem different or that concern you. MAKE SURE YOU:   Understand these instructions.  Will watch your condition.  Will get help right away if you are not doing well or get worse.   This information is not intended to replace advice given to you by your health care provider. Make sure you discuss any questions you have with your health care provider.   Document Released: 05/12/2005 Document Revised: 08/04/2011 Document Reviewed: 01/14/2013 Elsevier Interactive Patient Education 2016 [place] of Pregnancy The first trimester of pregnancy is from week 1 until the end of week 12 (months 1 through 3). A week after a sperm fertilizes an egg, the egg will implant on the wall of the uterus. This embryo will begin to develop into a baby. Genes from you and your  partner are forming the baby. The female genes determine whether the baby is a boy or a girl. At 6-8 weeks, the eyes and face are formed, and the heartbeat can be seen on ultrasound. At the end of 12 weeks, all the baby's organs are formed.  Now that you are pregnant, you will want to do everything you can to have a healthy baby. Two of the most important things are to get good prenatal care and to follow your health care provider's instructions. Prenatal care is all the medical care you receive before the baby's birth. This care will help prevent, find, and treat any problems during the pregnancy and childbirth. BODY CHANGES Your body goes through many changes during pregnancy. The changes vary from woman to woman.   You may gain or lose a couple of pounds at first.  You may feel sick to your stomach (nauseous) and throw up (vomit). If the vomiting is uncontrollable, call your health care provider.  You may tire easily.  You may  develop headaches that can be relieved by medicines approved by your health care provider.  You may urinate more often. Painful urination may mean you have a bladder infection.  You may develop heartburn as a result of your pregnancy.  You may develop constipation because certain hormones are causing the muscles that push waste through your intestines to slow down.  You may develop hemorrhoids or swollen, bulging veins (varicose veins).  Your breasts may begin to grow larger and become tender. Your nipples may stick out more, and the tissue that surrounds them (areola) may become darker.  Your gums may bleed and may be sensitive to brushing and flossing.  Dark spots or blotches (chloasma, mask of pregnancy) may develop on your face. This will likely fade after the baby is born.  Your menstrual periods will stop.  You may have a loss of appetite.  You may develop cravings for certain kinds of food.  You may have changes in your emotions from day to day, such as  being excited to be pregnant or being concerned that something may go wrong with the pregnancy and baby.  You may have more vivid and strange dreams.  You may have changes in your hair. These can include thickening of your hair, rapid growth, and changes in texture. Some women also have hair loss during or after pregnancy, or hair that feels dry or thin. Your hair will most likely return to normal after your baby is born. WHAT TO EXPECT AT YOUR PRENATAL VISITS During a routine prenatal visit:  You will be weighed to make sure you and the baby are growing normally.  Your blood pressure will be taken.  Your abdomen will be measured to track your baby's growth.  The fetal heartbeat will be listened to starting around week 10 or 12 of your pregnancy.  Test results from any previous visits will be discussed. Your health care provider may ask you:  How you are feeling.  If you are feeling the baby move.  If you have had any abnormal symptoms, such as leaking fluid, bleeding, severe headaches, or abdominal cramping.  If you are using any tobacco products, including cigarettes, chewing tobacco, and electronic cigarettes.  If you have any questions. Other tests that may be performed during your first trimester include:  Blood tests to find your blood type and to check for the presence of any previous infections. They will also be used to check for low iron levels (anemia) and Rh antibodies. Later in the pregnancy, blood tests for diabetes will be done along with other tests if problems develop.  Urine tests to check for infections, diabetes, or protein in the urine.  An ultrasound to confirm the proper growth and development of the baby.  An amniocentesis to check for possible genetic problems.  Fetal screens for spina bifida and Down syndrome.  You may need other tests to make sure you and the baby are doing well.  HIV (human immunodeficiency virus) testing. Routine prenatal testing  includes screening for HIV, unless you choose not to have this test. HOME CARE INSTRUCTIONS  Medicines  Follow your health care provider's instructions regarding medicine use. Specific medicines may be either safe or unsafe to take during pregnancy.  Take your prenatal vitamins as directed.  If you develop constipation, try taking a stool softener if your health care provider approves. Diet  Eat regular, well-balanced meals. Choose a variety of foods, such as meat or vegetable-based protein, fish, milk and low-fat dairy products,  vegetables, fruits, and whole grain breads and cereals. Your health care provider will help you determine the amount of weight gain that is right for you.  Avoid raw meat and uncooked cheese. These carry germs that can cause birth defects in the baby.  Eating four or five small meals rather than three large meals a day may help relieve nausea and vomiting. If you start to feel nauseous, eating a few soda crackers can be helpful. Drinking liquids between meals instead of during meals also seems to help nausea and vomiting.  If you develop constipation, eat more high-fiber foods, such as fresh vegetables or fruit and whole grains. Drink enough fluids to keep your urine clear or pale yellow. Activity and Exercise  Exercise only as directed by your health care provider. Exercising will help you:  Control your weight.  Stay in shape.  Be prepared for labor and delivery.  Experiencing pain or cramping in the lower abdomen or low back is a good sign that you should stop exercising. Check with your health care provider before continuing normal exercises.  Try to avoid standing for long periods of time. Move your legs often if you must stand in one place for a long time.  Avoid heavy lifting.  Wear low-heeled shoes, and practice good posture.  You may continue to have sex unless your health care provider directs you otherwise. Relief of Pain or Discomfort  Wear  a good support bra for breast tenderness.   Take warm sitz baths to soothe any pain or discomfort caused by hemorrhoids. Use hemorrhoid cream if your health care provider approves.   Rest with your legs elevated if you have leg cramps or low back pain.  If you develop varicose veins in your legs, wear support hose. Elevate your feet for 15 minutes, 3-4 times a day. Limit salt in your diet. Prenatal Care  Schedule your prenatal visits by the twelfth week of pregnancy. They are usually scheduled monthly at first, then more often in the last 2 months before delivery.  Write down your questions. Take them to your prenatal visits.  Keep all your prenatal visits as directed by your health care provider. Safety  Wear your seat belt at all times when driving.  Make a list of emergency phone numbers, including numbers for family, friends, the hospital, and police and fire departments. General Tips  Ask your health care provider for a referral to a local prenatal education class. Begin classes no later than at the beginning of month 6 of your pregnancy.  Ask for help if you have counseling or nutritional needs during pregnancy. Your health care provider can offer advice or refer you to specialists for help with various needs.  Do not use hot tubs, steam rooms, or saunas.  Do not douche or use tampons or scented sanitary pads.  Do not cross your legs for long periods of time.  Avoid cat litter boxes and soil used by cats. These carry germs that can cause birth defects in the baby and possibly loss of the fetus by miscarriage or stillbirth.  Avoid all smoking, herbs, alcohol, and medicines not prescribed by your health care provider. Chemicals in these affect the formation and growth of the baby.  Do not use any tobacco products, including cigarettes, chewing tobacco, and electronic cigarettes. If you need help quitting, ask your health care provider. You may receive counseling support and  other resources to help you quit.  Schedule a dentist appointment. At home, brush your  teeth with a soft toothbrush and be gentle when you floss. SEEK MEDICAL CARE IF:   You have dizziness.  You have mild pelvic cramps, pelvic pressure, or nagging pain in the abdominal area.  You have persistent nausea, vomiting, or diarrhea.  You have a bad smelling vaginal discharge.  You have pain with urination.  You notice increased swelling in your face, hands, legs, or ankles. SEEK IMMEDIATE MEDICAL CARE IF:   You have a fever.  You are leaking fluid from your vagina.  You have spotting or bleeding from your vagina.  You have severe abdominal cramping or pain.  You have rapid weight gain or loss.  You vomit blood or material that looks like coffee grounds.  You are exposed to [place] measles and have never had them.  You are exposed to fifth disease or chickenpox.  You develop a severe headache.  You have shortness of breath.  You have any kind of trauma, such as from a fall or a car accident.   This information is not intended to replace advice given to you by your health care provider. Make sure you discuss any questions you have with your health care provider.   Document Released: 05/06/2001 Document Revised: 06/02/2014 Document Reviewed: 03/22/2013 Elsevier Interactive Patient Education [employer].

## 2015-08-31 NOTE — ED Provider Notes (Signed)
CSN: [id number]     Arrival date & time 08/31/15  1145 History   First MD Initiated Contact with Patient 08/31/15 1218     Chief Complaint  Patient presents with  . Shoulder Pain     (Consider location/radiation/quality/duration/timing/severity/associated sxs/prior Treatment) HPI  [name] who presents the emergency department, chief complaint of right shoulder pain. She has a past medical history of fibromyalgia, joint pain, drug dependence. The patient states that last night she tripped over a laundry basket, falling onto her right shoulder. She denies hitting her head or losing consciousness. She states that her shoulder hurts severely and that she has been using lidocaine patches without relief. She denies any Tylenol or Motrin use. Patient states "I have fibromyalgia so Tylenol doesn't really work for me." Patient has a urine pregnancy positive result today. She states that her last menstrual period was February, 14th 2017. She is [id number]. She denies fevers, chills, urinary symptoms, vaginal symptoms.  Past Medical History  Diagnosis Date  . Joint pain   . Fibromyalgia   . Anxiety     no meds since pregnancy  . Headache(784.0)     otc med prn  . Anemia     history with pregnancy  . Degenerative lumbar disc   . Back pain   . Hypertension    Past Surgical History  Procedure Laterality Date  . Back surgery    . Cesarean section      x 3  . Wisdom tooth extraction    . Multiple tooth extractions      upper dneures and lower partial  . Dilation and evacuation N/A 05/06/2014    Procedure: DILATATION AND EVACUATION;  Surgeon: Donnamae Jude, MD;  Location: [facility];  Service: Gynecology;  Laterality: N/A;   Family History  Problem Relation Age of Onset  . Anesthesia problems Neg Hx   . Asthma Mother   . Hypertension Mother    Social History  Substance Use Topics  . Smoking status: Current Every Day Smoker -- 0.25 packs/day for 15 years    Types:  Cigarettes  . Smokeless tobacco: Never Used  . Alcohol Use: No     Comment: socially but none with pregnancy.     OB History    Gravida Para Term Preterm AB TAB SAB Ectopic Multiple Living   5 3 3  0 1 0 0 1 0 2      Obstetric Comments   Last pregnancy no heartbeat at 9weeks     Review of Systems  Ten systems reviewed and are negative for acute change, except as noted in the HPI.    Allergies  Review of patient's allergies indicates no known allergies.  Home Medications   Prior to Admission medications   Medication Sig Start Date End Date Taking? Authorizing Provider  carbamide peroxide (DEBROX) 6.5 % otic solution Place 5 drops into both ears 2 (two) times daily. 01/25/15   Clayton Bibles, PA-C  ibuprofen (ADVIL,MOTRIN) 600 MG tablet Take 1 tablet (600 mg total) by mouth every 6 (six) hours as needed. 05/06/14   Donnamae Jude, MD  oxyCODONE-acetaminophen (PERCOCET/ROXICET) 5-325 MG per tablet Take 1 tablet by mouth 3 (three) times daily as needed for moderate pain or severe pain. 04/10/14   [name], PA-C  pseudoephedrine (SUDAFED) 60 MG tablet Take 1 tablet (60 mg total) by mouth every 6 (six) hours as needed for congestion. 01/25/15   Clayton Bibles, PA-C   BP 122/72  mmHg  Pulse 72  Temp(Src) 97.7 F (36.5 C) (Oral)  Resp 16  Ht 5' (1.524 m)  Wt 91.173 kg  BMI 39.26 kg/m2  SpO2 100%  LMP 07/08/2015 Physical Exam  Constitutional: She is oriented to person, place, and time. She appears well-developed and well-nourished. No distress.  HENT:  Head: Normocephalic and atraumatic.  Eyes: Conjunctivae are normal. No scleral icterus.  Neck: Normal range of motion.  Cardiovascular: Normal rate, regular rhythm and normal heart sounds.  Exam reveals no gallop and no friction rub.   No murmur heard. Pulmonary/Chest: Effort normal and breath sounds normal. No respiratory distress.  Abdominal: Soft. Bowel sounds are normal. She exhibits no distension and no mass. There is no  tenderness. There is no guarding.  Musculoskeletal:  Right arm without bruising, deformity, bony tenderness. Patient exam limited due to perceived pain. Neurovascularly intact.  Neurological: She is alert and oriented to person, place, and time.  Skin: Skin is warm and dry. She is not diaphoretic.  Nursing note and vitals reviewed.   ED Course  Procedures (including critical care time) Labs Review Labs Reviewed  POC URINE PREG, ED - Abnormal; Notable for the following:    Preg Test, Ur POSITIVE (*)    All other components within normal limits  HCG, QUANTITATIVE, PREGNANCY    Imaging Review Dg Shoulder Right  08/31/2015  CLINICAL DATA:  Fall last night, shoulder pain EXAM: RIGHT SHOULDER - 2+ VIEW COMPARISON:  None. FINDINGS: Three views of the right shoulder submitted. No acute fracture or subluxation. No radiopaque foreign body. AC joint and glenohumeral joint are preserved. Minimal spurring of distal clavicle. IMPRESSION: Negative. Electronically Signed   By: Lahoma Crocker M.D.   On: 08/31/2015 13:03   I have personally reviewed and evaluated these images and lab results as part of my medical decision-making.   EKG Interpretation None      MDM   Final diagnoses:  Shoulder pain, right  Pregnant    Patient urine pregnancy positive. Quantitative urine pregnancy matches up with estimated 7 weeks of gestational age. Her x-ray is negative. Patient's applied with ice, sling, supportive care and Tylenol for pain. Patient is advised to follow-up with women's outpatient clinic and orthopedics if her shoulder is not improving. I doubt any underlying abnormality such as occult fracture or dislocation. She given prenatal vitamins     Margarita Mail, PA-C 09/01/15 [place], MD 09/10/15 1144

## 2015-09-15 ENCOUNTER — Inpatient Hospital Stay (HOSPITAL_COMMUNITY): Payer: Medicaid Other

## 2015-09-15 ENCOUNTER — Encounter (HOSPITAL_COMMUNITY): Payer: Self-pay | Admitting: *Deleted

## 2015-09-15 ENCOUNTER — Inpatient Hospital Stay (HOSPITAL_COMMUNITY)
Admission: AD | Admit: 2015-09-15 | Discharge: 2015-09-15 | Disposition: A | Discharge status: Home / Self Care | Payer: Medicaid Other | Source: Ambulatory Visit | Attending: Obstetrics & Gynecology | Admitting: Obstetrics & Gynecology

## 2015-09-15 DIAGNOSIS — M797 Fibromyalgia: Secondary | ICD-10-CM | POA: Diagnosis not present

## 2015-09-15 DIAGNOSIS — O161 Unspecified maternal hypertension, first trimester: Secondary | ICD-10-CM | POA: Insufficient documentation

## 2015-09-15 DIAGNOSIS — O418X10 Other specified disorders of amniotic fluid and membranes, first trimester, not applicable or unspecified: Secondary | ICD-10-CM

## 2015-09-15 DIAGNOSIS — O4691 Antepartum hemorrhage, unspecified, first trimester: Secondary | ICD-10-CM

## 2015-09-15 DIAGNOSIS — O99331 Smoking (tobacco) complicating pregnancy, first trimester: Secondary | ICD-10-CM | POA: Diagnosis not present

## 2015-09-15 DIAGNOSIS — O209 Hemorrhage in early pregnancy, unspecified: Secondary | ICD-10-CM | POA: Insufficient documentation

## 2015-09-15 DIAGNOSIS — F17210 Nicotine dependence, cigarettes, uncomplicated: Secondary | ICD-10-CM | POA: Insufficient documentation

## 2015-09-15 DIAGNOSIS — O468X1 Other antepartum hemorrhage, first trimester: Secondary | ICD-10-CM

## 2015-09-15 DIAGNOSIS — Z3A09 9 weeks gestation of pregnancy: Secondary | ICD-10-CM | POA: Insufficient documentation

## 2015-09-15 LAB — CBC
HCT: 35.7 % — ABNORMAL LOW (ref 36.0–46.0)
Hemoglobin: 12.3 g/dL (ref 12.0–15.0)
MCH: 27.5 pg (ref 26.0–34.0)
MCHC: 34.5 g/dL (ref 30.0–36.0)
MCV: 79.9 fL (ref 78.0–100.0)
Platelets: 256 10*3/uL (ref 150–400)
RBC: 4.47 MIL/uL (ref 3.87–5.11)
RDW: 15.2 % (ref 11.5–15.5)
WBC: 13.3 10*3/uL — ABNORMAL HIGH (ref 4.0–10.5)

## 2015-09-15 LAB — HCG, QUANTITATIVE, PREGNANCY: hCG, Beta Chain, Quant, S: 105067 m[IU]/mL — ABNORMAL HIGH (ref <5)

## 2015-09-15 MED ORDER — HYDROMORPHONE HCL 2 MG/ML IJ SOLN
2.0000 mg | Freq: Once | INTRAMUSCULAR | Status: AC
  Administered 2015-09-15: 2 mg via INTRAMUSCULAR
  Filled 2015-09-15: qty 1

## 2015-09-15 NOTE — Discharge Instructions (Signed)
Pelvic Rest Pelvic rest is sometimes recommended for women when:   The placenta is partially or completely covering the opening of the cervix (placenta previa).  There is bleeding between the uterine wall and the amniotic sac in the first trimester (subchorionic hemorrhage).  The cervix begins to open without labor starting (incompetent cervix, cervical insufficiency).  The labor is too early (preterm labor). HOME CARE INSTRUCTIONS  Do not have sexual intercourse, stimulation, or an orgasm.  Do not use tampons, douche, or put anything in the vagina.  Do not lift anything over 10 pounds (4.5 kg).  Avoid strenuous activity or straining your pelvic muscles. SEEK MEDICAL CARE IF:  You have any vaginal bleeding during pregnancy. Treat this as a potential emergency.  You have cramping pain felt low in the stomach (stronger than menstrual cramps).  You notice vaginal discharge (watery, mucus, or bloody).  You have a low, dull backache.  There are regular contractions or uterine tightening. SEEK IMMEDIATE MEDICAL CARE IF: You have vaginal bleeding and have placenta previa.    This information is not intended to replace advice given to you by your health care provider. Make sure you discuss any questions you have with your health care provider.   Document Released: 09/06/2010 Document Revised: 08/04/2011 Document Reviewed: 11/13/2014 Elsevier Interactive Patient Education 2016 Elsevier Inc.  Vaginal Bleeding During Pregnancy, First Trimester A small amount of bleeding (spotting) from the vagina is relatively common in early pregnancy. It usually stops on its own. Various things may cause bleeding or spotting in early pregnancy. Some bleeding may be related to the pregnancy, and some may not. In most cases, the bleeding is normal and is not a problem. However, bleeding can also be a sign of something serious. Be sure to tell your health care provider about any vaginal bleeding right  away. Some possible causes of vaginal bleeding during the first trimester include:  Infection or inflammation of the cervix.  Growths (polyps) on the cervix.  Miscarriage or threatened miscarriage.  Pregnancy tissue has developed outside of the uterus and in a fallopian tube (tubal pregnancy).  Tiny cysts have developed in the uterus instead of pregnancy tissue (molar pregnancy). HOME CARE INSTRUCTIONS  Watch your condition for any changes. The following actions may help to lessen any discomfort you are feeling:  Follow your health care provider's instructions for limiting your activity. If your health care provider orders bed rest, you may need to stay in bed and only get up to use the bathroom. However, your health care provider may allow you to continue light activity.  If needed, make plans for someone to help with your regular activities and responsibilities while you are on bed rest.  Keep track of the number of pads you use each day, how often you change pads, and how soaked (saturated) they are. Write this down.  Do not use tampons. Do not douche.  Do not have sexual intercourse or orgasms until approved by your health care provider.  If you pass any tissue from your vagina, save the tissue so you can show it to your health care provider.  Only take over-the-counter or prescription medicines as directed by your health care provider.  Do not take aspirin because it can make you bleed.  Keep all follow-up appointments as directed by your health care provider. SEEK MEDICAL CARE IF:  You have any vaginal bleeding during any part of your pregnancy.  You have cramps or labor pains.  You have a fever, not  controlled by medicine. SEEK IMMEDIATE MEDICAL CARE IF:   You have severe cramps in your back or belly (abdomen).  You pass large clots or tissue from your vagina.  Your bleeding increases.  You feel light-headed or weak, or you have fainting episodes.  You have  chills.  You are leaking fluid or have a gush of fluid from your vagina.  You pass out while having a bowel movement. MAKE SURE YOU:  Understand these instructions.  Will watch your condition.  Will get help right away if you are not doing well or get worse.   This information is not intended to replace advice given to you by your health care provider. Make sure you discuss any questions you have with your health care provider.   Document Released: 02/19/2005 Document Revised: 05/17/2013 Document Reviewed: 01/17/2013 Elsevier Interactive Patient Education 2016 Elsevier Inc.  Vaginal Bleeding During Pregnancy, First Trimester A small amount of bleeding (spotting) from the vagina is common in early pregnancy. Sometimes the bleeding is normal and is not a problem, and sometimes it is a sign of something serious. Be sure to tell your doctor about any bleeding from your vagina right away. HOME CARE  Watch your condition for any changes.  Follow your doctor's instructions about how active you can be.  If you are on bed rest:  You may need to stay in bed and only get up to use the bathroom.  You may be allowed to do some activities.  If you need help, make plans for someone to help you.  Write down:  The number of pads you use each day.  How often you change pads.  How soaked (saturated) your pads are.  Do not use tampons.  Do not douche.  Do not have sex or orgasms until your doctor says it is okay.  If you pass any tissue from your vagina, save the tissue so you can show it to your doctor.  Only take medicines as told by your doctor.  Do not take aspirin because it can make you bleed.  Keep all follow-up visits as told by your doctor. GET HELP IF:   You bleed from your vagina.  You have cramps.  You have labor pains.  You have a fever that does not go away after you take medicine. GET HELP RIGHT AWAY IF:   You have very bad cramps in your back or belly  (abdomen).  You pass large clots or tissue from your vagina.  You bleed more.  You feel light-headed or weak.  You pass out (faint).  You have chills.  You are leaking fluid or have a gush of fluid from your vagina.  You pass out while pooping (having a bowel movement). MAKE SURE YOU:  Understand these instructions.  Will watch your condition.  Will get help right away if you are not doing well or get worse.   This information is not intended to replace advice given to you by your health care provider. Make sure you discuss any questions you have with your health care provider.   Document Released: 09/26/2013 Document Reviewed: 09/26/2013 Elsevier Interactive Patient Education 2016 Cool Medications in Pregnancy   Acne: Benzoyl Peroxide Salicylic Acid  Backache/Headache: Tylenol: 2 regular strength every 4 hours OR              2 Extra strength every 6 hours  Colds/Coughs/Allergies: Benadryl (alcohol free) 25 mg every 6 hours as needed Breath right strips Claritin Cepacol  throat lozenges Chloraseptic throat spray Cold-Eeze- up to three times per day Cough drops, alcohol free Flonase (by prescription only) Guaifenesin Mucinex Robitussin DM (plain only, alcohol free) Saline nasal spray/drops Sudafed (pseudoephedrine) & Actifed ** use only after [redacted] weeks gestation and if you do not have high blood pressure Tylenol Vicks Vaporub Zinc lozenges Zyrtec   Constipation: Colace Ducolax suppositories Fleet enema Glycerin suppositories Metamucil Milk of magnesia Miralax Senokot Smooth move tea  Diarrhea: Kaopectate Imodium A-D  *NO pepto Bismol  Hemorrhoids: Anusol Anusol HC Preparation H Tucks  Indigestion: Tums Maalox Mylanta Zantac  Pepcid  Insomnia: Benadryl (alcohol free) 25mg  every 6 hours as needed Tylenol PM Unisom, no Gelcaps  Leg Cramps: Tums MagGel  Nausea/Vomiting:  Bonine Dramamine Emetrol Ginger  extract Sea bands Meclizine  Nausea medication to take during pregnancy:  Unisom (doxylamine succinate 25 mg tablets) Take one tablet daily at bedtime. If symptoms are not adequately controlled, the dose can be increased to a maximum recommended dose of two tablets daily (1/2 tablet in the morning, 1/2 tablet mid-afternoon and one at bedtime). Vitamin B6 100mg  tablets. Take one tablet twice a day (up to 200 mg per day).  Skin Rashes: Aveeno products Benadryl cream or 25mg  every 6 hours as needed Calamine Lotion 1% cortisone cream  Yeast infection: Gyne-lotrimin 7 Monistat 7   **If taking multiple medications, please check labels to avoid duplicating the same active ingredients **take medication as directed on the label ** Do not exceed 4000 mg of tylenol in 24 hours **Do not take medications that contain aspirin or ibuprofen   Prenatal Care Providers [facility]  [phone]     Phone: [place]                      [facility]  @[place]     Phone: [phone]  Phone: [place]     Phone: [phone]  Phone: [facility] [place]                hone: [phone]  Phone: [phone]         [facility] [address] Racer      Phone: [phone]  Phone: [phone]         [place] [facility]                Phone: [phone]  [place])          Phone: [phone] [facility]   Phone: [phone]

## 2015-09-15 NOTE — MAU Provider Note (Signed)
History     CSN: [id number]  Arrival date and time: 09/15/15 1607   First Provider Initiated Contact with Patient 09/15/15 1624      Chief Complaint  Patient presents with  . Abdominal Pain  . Vaginal Bleeding   HPI Kimberly Pace 40 y.o. [id number] @ [redacted]w[redacted]d presents stating she has had a large amount of bleeding today. She also states she is cramping.  Past Medical History  Diagnosis Date  . Joint pain   . Fibromyalgia   . Anxiety     no meds since pregnancy  . Headache(784.0)     otc med prn  . Anemia     history with pregnancy  . Degenerative lumbar disc   . Back pain   . Hypertension     Past Surgical History  Procedure Laterality Date  . Back surgery    . Cesarean section      x 3  . Wisdom tooth extraction    . Multiple tooth extractions      upper dneures and lower partial  . Dilation and evacuation N/A 05/06/2014    Procedure: DILATATION AND EVACUATION;  Surgeon: Donnamae Jude, MD;  Location: [facility];  Service: Gynecology;  Laterality: N/A;    Family History  Problem Relation Age of Onset  . Anesthesia problems Neg Hx   . Asthma Mother   . Hypertension Mother     Social History  Substance Use Topics  . Smoking status: Light Tobacco Smoker -- 0.25 packs/day for 15 years    Types: Cigarettes  . Smokeless tobacco: Never Used  . Alcohol Use: No     Comment: socially but none with pregnancy.      Allergies: No Known Allergies  Prescriptions prior to admission  Medication Sig Dispense Refill Last Dose  . Prenatal Vit-Fe Fumarate-FA (GOODSENSE PRENATAL VITAMINS) 28-0.8 MG TABS Take 1 tablet by mouth at bedtime. 30 tablet 0 09/14/2015 at Unknown time    Review of Systems  Constitutional: Negative for fever.  Gastrointestinal: Positive for abdominal pain.  Genitourinary:       Large amount vaginal bleeding  All other systems reviewed and are negative.  Physical Exam   Blood pressure 141/97, pulse 104, temperature 98.6 F (37 C), resp. rate 18,  last menstrual period 07/08/2015, unknown if currently breastfeeding.  Physical Exam  Nursing note and vitals reviewed. Constitutional: She appears well-developed and well-nourished.  Cardiovascular: Normal rate.   Respiratory: Effort normal and breath sounds normal. No respiratory distress.  GI: Soft. There is no tenderness.  Genitourinary: Vagina normal.  Moderate amount of vaginal bleeding  Musculoskeletal: Normal range of motion.  Skin: Skin is warm and dry.  Psychiatric: She has a normal mood and affect. Her behavior is normal. Judgment and thought content normal.   Results for orders placed or performed during the hospital encounter of 09/15/15 (from the past 24 hour(s))  hCG, quantitative, pregnancy     Status: Abnormal   Collection Time: 09/15/15  4:44 PM  Result Value Ref Range   hCG, Beta Chain, America Brown, S 105067 (H) <5 mIU/mL  CBC     Status: Abnormal   Collection Time: 09/15/15  4:44 PM  Result Value Ref Range   WBC 13.3 (H) 4.0 - 10.5 K/uL   RBC 4.47 3.87 - 5.11 MIL/uL   Hemoglobin 12.3 12.0 - 15.0 g/dL   HCT 35.7 (L) 36.0 - 46.0 %   MCV 79.9 78.0 - 100.0 fL   MCH 27.5 26.0 - 34.0  pg   MCHC 34.5 30.0 - 36.0 g/dL   RDW 15.2 11.5 - 15.5 %   Platelets 256 150 - 400 K/uL  US Ob Comp Less 14 Wks  09/15/2015  CLINICAL DATA:  40 year old pregnant female presenting with few hours of vaginal bleeding. Quantitative beta HCG 105,067. EDC by LMP: 04/13/2016, projecting to an expected gestational age of [redacted] weeks 6 days. EXAM: OBSTETRIC <14 WK ULTRASOUND TECHNIQUE: Transabdominal ultrasound was performed for evaluation of the gestation as well as the maternal uterus and adnexal regions. COMPARISON:  No prior scans from this gestation. FINDINGS: Transabdominal ultrasound images are limited by patient related factors. Transvaginal scan was declined by the patient. Intrauterine gestational sac: Single intrauterine gestational sac appears normal in size, shape and position. Yolk sac:  Not  visualized. Fetus: Present. Fetal anatomy not assessed at this early gestational age. Fetal Cardiac Activity: Regular rate and rhythm. Fetal Heart Rate: 165 bpm CRL:   31.4  mm   10 w 0 d                  [place] EDC: 04/12/2016 Subchorionic hemorrhage: There is a moderate to large perigestational bleed involving approximately 70-80% of the gestational sac circumference, with an apparent 3.4 x 3.8 x 4.5 cm hematoma in the lower endometrial cavity. Maternal uterus/adnexae: Left ovary measures 1.9 x 2.4 x 2.5 cm. Right ovary not visualized. No adnexal masses. No abnormal free fluid the pelvis. IMPRESSION: 1. Single living intrauterine gestation at 10 weeks 0 days by crown-rump length, concordant with menstrual dating. Normal fetal cardiac activity. 2. Moderate to large perigestational bleed as described. A follow-up obstetric scan is recommended in 4 weeks to assess fetal growth given this high-risk finding. 3. No adnexal abnormalities. Electronically Signed   By: Ilona Sorrel M.D.   On: 09/15/2015 18:11   MAU Course  Procedures  MDM [place]  Assessment and Plan  Subchorionic hemorrhage  Bleeding precautions Start Prenatal care Discharge to home   [facility] 09/15/2015, 6:44 PM

## 2015-09-15 NOTE — MAU Note (Signed)
Onset of vaginal bleeding prior to coming to MAU.

## 2015-10-12 ENCOUNTER — Ambulatory Visit (INDEPENDENT_AMBULATORY_CARE_PROVIDER_SITE_OTHER): Payer: Medicaid Other | Admitting: Obstetrics & Gynecology

## 2015-10-12 ENCOUNTER — Encounter: Payer: Self-pay | Admitting: Obstetrics & Gynecology

## 2015-10-12 ENCOUNTER — Ambulatory Visit (HOSPITAL_COMMUNITY)
Admission: RE | Admit: 2015-10-12 | Discharge: 2015-10-12 | Disposition: A | Discharge status: Home / Self Care | Payer: Medicaid Other | Source: Ambulatory Visit | Attending: Obstetrics & Gynecology | Admitting: Obstetrics & Gynecology

## 2015-10-12 ENCOUNTER — Other Ambulatory Visit: Payer: Self-pay | Admitting: Obstetrics & Gynecology

## 2015-10-12 VITALS — BP 153/77 | HR 87 | Temp 98.4°F | Wt 196.9 lb

## 2015-10-12 DIAGNOSIS — Z113 Encounter for screening for infections with a predominantly sexual mode of transmission: Secondary | ICD-10-CM

## 2015-10-12 DIAGNOSIS — O0912 Supervision of pregnancy with history of ectopic or molar pregnancy, second trimester: Secondary | ICD-10-CM

## 2015-10-12 DIAGNOSIS — O3411 Maternal care for benign tumor of corpus uteri, first trimester: Secondary | ICD-10-CM | POA: Diagnosis not present

## 2015-10-12 DIAGNOSIS — Z1151 Encounter for screening for human papillomavirus (HPV): Secondary | ICD-10-CM | POA: Diagnosis not present

## 2015-10-12 DIAGNOSIS — IMO0002 Reserved for concepts with insufficient information to code with codable children: Secondary | ICD-10-CM

## 2015-10-12 DIAGNOSIS — Z36 Encounter for antenatal screening of mother: Secondary | ICD-10-CM | POA: Insufficient documentation

## 2015-10-12 DIAGNOSIS — D259 Leiomyoma of uterus, unspecified: Secondary | ICD-10-CM | POA: Diagnosis not present

## 2015-10-12 DIAGNOSIS — Z3A13 13 weeks gestation of pregnancy: Secondary | ICD-10-CM | POA: Insufficient documentation

## 2015-10-12 DIAGNOSIS — O09522 Supervision of elderly multigravida, second trimester: Secondary | ICD-10-CM | POA: Diagnosis not present

## 2015-10-12 DIAGNOSIS — O09529 Supervision of elderly multigravida, unspecified trimester: Secondary | ICD-10-CM | POA: Insufficient documentation

## 2015-10-12 DIAGNOSIS — Z23 Encounter for immunization: Secondary | ICD-10-CM | POA: Diagnosis not present

## 2015-10-12 DIAGNOSIS — Z124 Encounter for screening for malignant neoplasm of cervix: Secondary | ICD-10-CM

## 2015-10-12 DIAGNOSIS — O09292 Supervision of pregnancy with other poor reproductive or obstetric history, second trimester: Secondary | ICD-10-CM | POA: Diagnosis not present

## 2015-10-12 LAB — POCT URINALYSIS DIP (DEVICE)
Bilirubin Urine: NEGATIVE
GLUCOSE, UA: NEGATIVE mg/dL
Hgb urine dipstick: NEGATIVE
Ketones, ur: NEGATIVE mg/dL
Leukocytes, UA: NEGATIVE
NITRITE: NEGATIVE
PROTEIN: NEGATIVE mg/dL
SPECIFIC GRAVITY, URINE: 1.01 (ref 1.005–1.030)
UROBILINOGEN UA: 0.2 mg/dL (ref 0.0–1.0)
pH: 5.5 (ref 5.0–8.0)

## 2015-10-12 NOTE — Patient Instructions (Signed)

## 2015-10-12 NOTE — Progress Notes (Signed)
Subjective: has chronic pain, admits to taking some pain pill this pregnancy    Kimberly Pace is a [id number] [redacted]w[redacted]d being seen today for her first obstetrical visit.  Her obstetrical history is significant for advanced maternal age and h/o CS X3, chronic pain, h/o CS scar ectopic. Patient does intend to breast feed. Pregnancy history fully reviewed.  Patient reports chronic pain.  Filed Vitals:   10/12/15 1000  BP: 153/77  Pulse: 87  Temp: 98.4 F (36.9 C)  Weight: 196 lb 14.4 oz (89.313 kg)    HISTORY: OB History  Gravida Para Term Preterm AB SAB TAB Ectopic Multiple Living  6 3 3  0 2 1 0 1 0 2    # Outcome Date GA Lbr Len/2nd Weight Sex Delivery Anes PTL Lv  6 Current           5 Ectopic 2016             Comments: System Generated. Please review and update pregnancy details.  4 SAB 2016          3 Term 2007     CS-LTranv   Y  2 Term 2004     CS-LTranv   Y  1 Term 2001     CS-LTranv   ND     Comments: breech    Obstetric Comments  [facility] pregnancy [facility] at [facility]   Past Medical History  Diagnosis Date  . Joint pain   . Fibromyalgia   . Anxiety     no meds since pregnancy  . Headache(784.0)     otc med prn  . Anemia     history with pregnancy  . Degenerative lumbar disc   . Back pain   . Hypertension    Past Surgical History  Procedure Laterality Date  . Back surgery    . Cesarean section      x 3  . Wisdom tooth extraction    . Multiple tooth extractions      upper dneures and lower partial  . Dilation and evacuation N/A 05/06/2014    Procedure: DILATATION AND EVACUATION;  Surgeon: Donnamae Jude, MD;  Location: [facility];  Service: Gynecology;  Laterality: N/A;  . Dilation and curettage of uterus     Family History  Problem Relation Age of Onset  . Anesthesia problems Neg Hx   . Asthma Mother   . Hypertension Mother   . Diabetes Mother      Exam    Uterus:  Fundal Height: 12 cm  Pelvic Exam:    Perineum: No Hemorrhoids   Vulva: normal    Vagina:  normal mucosa   pH:     Cervix: no lesions   Adnexa: normal adnexa   Bony Pelvis: average  System: Breast:  normal appearance, no masses or tenderness   Skin: normal coloration and turgor, no rashes    Neurologic: oriented, normal mood, mildl   Extremities: normal strength, tone, and muscle mass   HEENT extra ocular movement intact and sclera clear, anicteric   Mouth/Teeth dental hygiene good   Neck supple   Cardiovascular: regular rate and rhythm   Respiratory:  appears well, vitals normal, no respiratory distress, acyanotic, normal RR   Abdomen: soft, non-tender; bowel sounds normal; no masses,  no organomegaly   Urinary: urethral meatus normal      Assessment:    Pregnancy: [id number] Patient Active Problem List   Diagnosis Date Noted  . Antepartum multigravida of advanced maternal age  10/12/2015  . Abortion, missed 05/06/2014  . Drug abuse and dependence ([place]) 03/29/2014  . History of fetal anomaly in prior pregnancy, currently pregnant 03/29/2014  . Smoker 03/08/2014  . Obesity 03/08/2014  . Prior ectopic pregnancy, antepartum 06/16/2013        Plan:     Initial labs drawn. Prenatal vitamins. Problem list reviewed and updated. Genetic Screening discussed Needs genetic counseling, pain management   Ultrasound discussed; fetal survey: at 18+ weeks.  Follow up in 4 weeks. 50% of 30 min visit spent on counseling and coordination of care.  [place] for viability   ARNOLD,JAMES 10/12/2015

## 2015-10-13 LAB — GLUCOSE TOLERANCE, 1 HOUR (50G) W/O FASTING: Glucose, 1 Hr, gestational: 113 mg/dL

## 2015-10-13 LAB — CULTURE, OB URINE: Colony Count: 50000

## 2015-10-15 LAB — PRENATAL PROFILE (SOLSTAS)
ANTIBODY SCREEN: NEGATIVE
BASOS ABS: 0 {cells}/uL (ref 0–200)
BASOS PCT: 0 %
EOS ABS: 123 {cells}/uL (ref 15–500)
Eosinophils Relative: 1 %
HCT: 35.3 % (ref 35.0–45.0)
HIV 1&2 Ab, 4th Generation: NONREACTIVE
Hemoglobin: 11.8 g/dL (ref 11.7–15.5)
Hepatitis B Surface Ag: NEGATIVE
LYMPHS PCT: 16 %
Lymphs Abs: 1968 cells/uL (ref 850–3900)
MCH: 27.2 pg (ref 27.0–33.0)
MCHC: 33.4 g/dL (ref 32.0–36.0)
MCV: 81.3 fL (ref 80.0–100.0)
MONOS PCT: 6 %
MPV: 11 fL (ref 7.5–12.5)
Monocytes Absolute: 738 cells/uL (ref 200–950)
Neutro Abs: 9471 cells/uL — ABNORMAL HIGH (ref 1500–7800)
Neutrophils Relative %: 77 %
PLATELETS: 228 10*3/uL (ref 140–400)
RBC: 4.34 MIL/uL (ref 3.80–5.10)
RDW: 15.8 % — ABNORMAL HIGH (ref 11.0–15.0)
RH TYPE: POSITIVE
RUBELLA: 2.27 {index} — AB (ref <0.90)
WBC: 12.3 10*3/uL — ABNORMAL HIGH (ref 3.8–10.8)

## 2015-10-15 LAB — GC/CHLAMYDIA PROBE AMP (~~LOC~~) NOT AT ARMC
CHLAMYDIA, DNA PROBE: NEGATIVE
NEISSERIA GONORRHEA: NEGATIVE

## 2015-10-16 LAB — BUPRENORPHINES (GC/LC/MS), URINE
BUPRENORPHINE (GC/LC/MS): 3.7 ng/mL — AB (ref <2)
Norbuprenorphine: 35 ng/mL — AB (ref <2)

## 2015-10-16 LAB — CYTOLOGY - PAP

## 2015-10-16 LAB — CANNABANOIDS (GC/LC/MS), URINE: THC-COOH (GC/LC/MS), ur confirm: 173 ng/mL — ABNORMAL HIGH (ref <5)

## 2015-10-17 LAB — PRESCRIPTION MONITORING PROFILE (19 PANEL)
Amphetamine/Meth: NEGATIVE ng/mL
BENZODIAZEPINE SCREEN, URINE: NEGATIVE ng/mL
Barbiturate Screen, Urine: NEGATIVE ng/mL
COCAINE METABOLITES: NEGATIVE ng/mL
Carisoprodol, Urine: NEGATIVE ng/mL
Creatinine, Urine: 78.22 mg/dL (ref >20.0)
ECSTASY: NEGATIVE ng/mL
FENTANYL URINE: NEGATIVE ng/mL
MEPERIDINE UR: NEGATIVE ng/mL
METHADONE SCREEN, URINE: NEGATIVE ng/mL
Methaqualone: NEGATIVE ng/mL
Nitrites, Initial: NEGATIVE ug/mL
OXYCODONE SCRN UR: NEGATIVE ng/mL
Opiate Screen, Urine: NEGATIVE ng/mL
PH URINE, INITIAL: 6.1 pH (ref 4.5–8.9)
PROPOXYPHENE: NEGATIVE ng/mL
Phencyclidine, Ur: NEGATIVE ng/mL
TRAMADOL UR: NEGATIVE ng/mL
Tapentadol, urine: NEGATIVE ng/mL
Zolpidem, Urine: NEGATIVE ng/mL

## 2015-10-18 LAB — CYSTIC FIBROSIS DIAGNOSTIC STUDY

## 2015-10-21 IMAGING — US US OB TRANSVAGINAL
1 series · 14 of 28 positions shown · non-contrast
Comparison: None.

CLINICAL DATA: Patient with history of 1 day of vaginal pain.

EXAM:
OBSTETRIC <14 WK US AND TRANSVAGINAL OB US
TECHNIQUE: Both transabdominal and transvaginal ultrasound examinations were
performed for complete evaluation of the gestation as well as the
maternal uterus, adnexal regions, and pelvic cul-de-sac.
Transvaginal technique was performed to assess early pregnancy.

[Series 1: us ob comp less 14 wks · 14 of 35 slices shown]
[im 2/35]
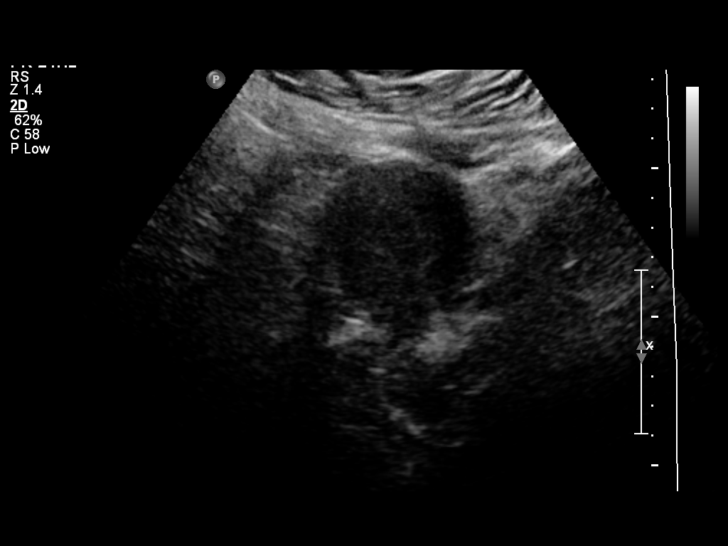
[im 4/35]
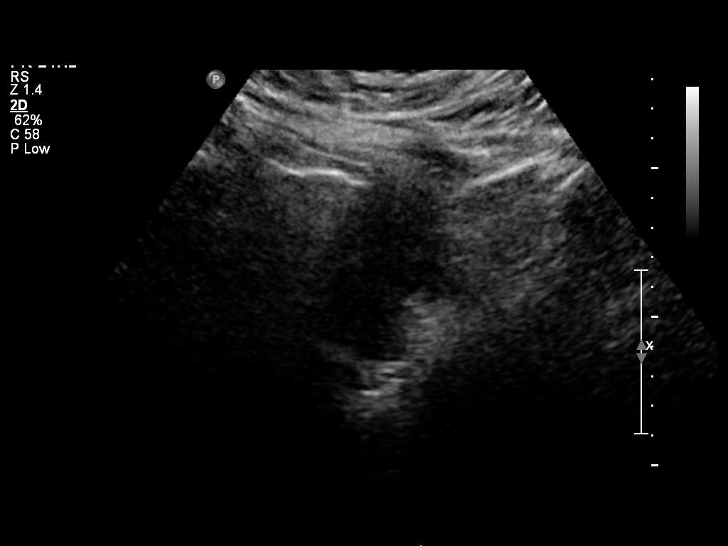
[im 7/35]
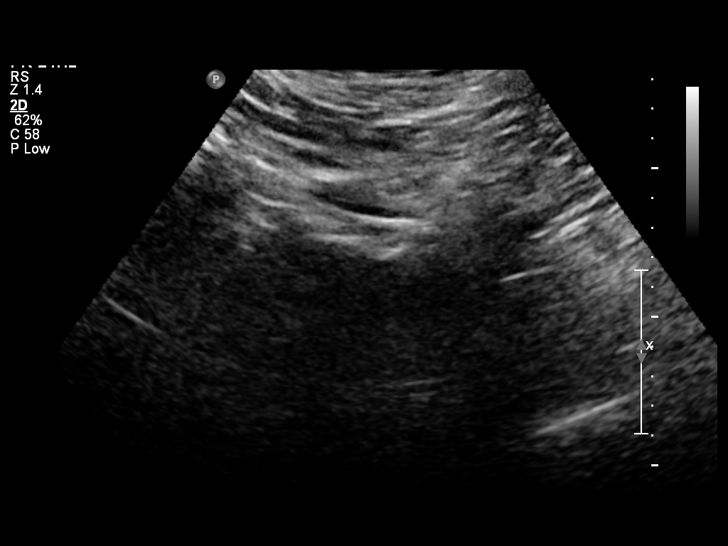
[im 9/35]
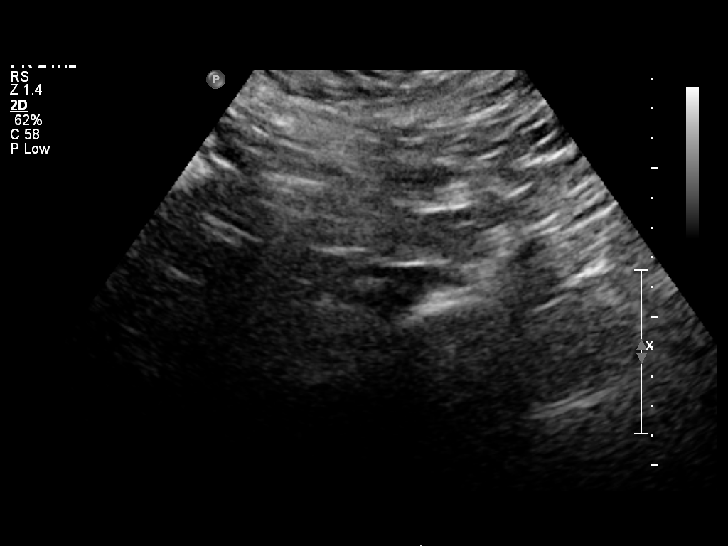
[im 12/35]
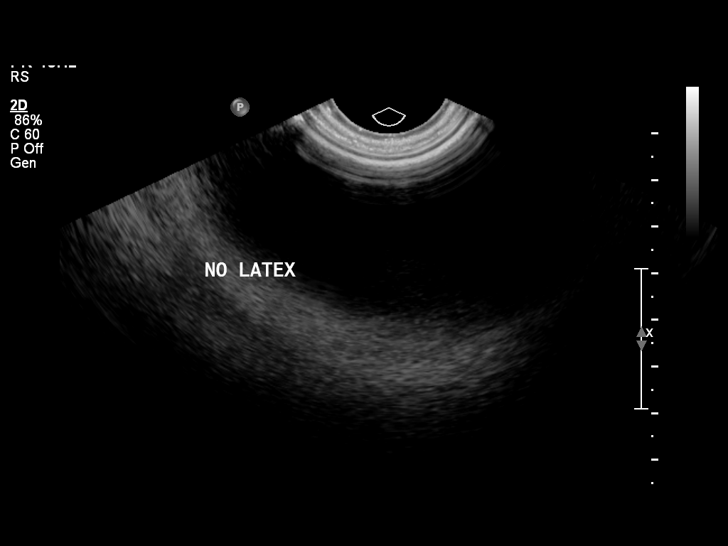
[im 14/35]
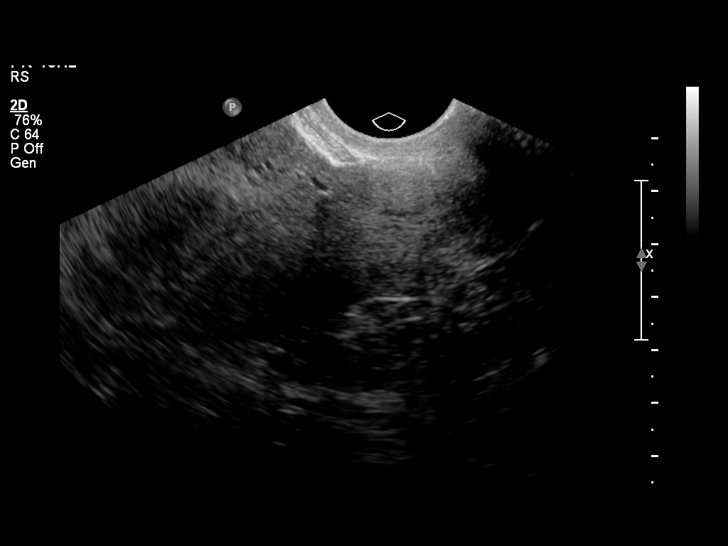
[im 17/35]
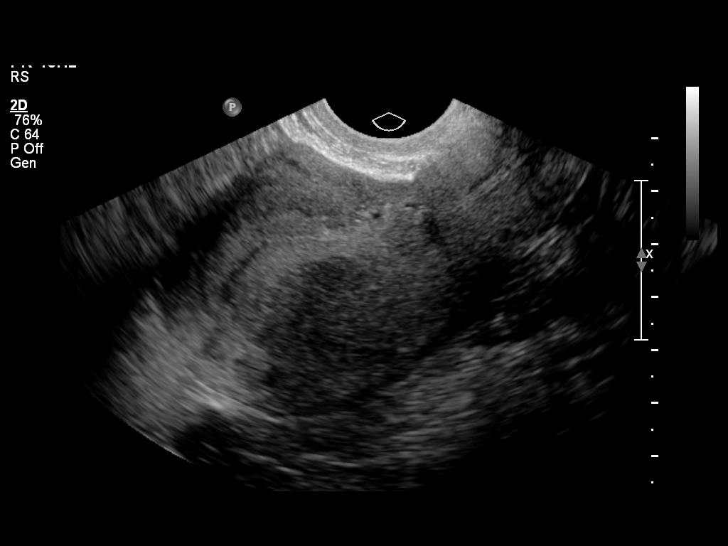
[im 19/35]
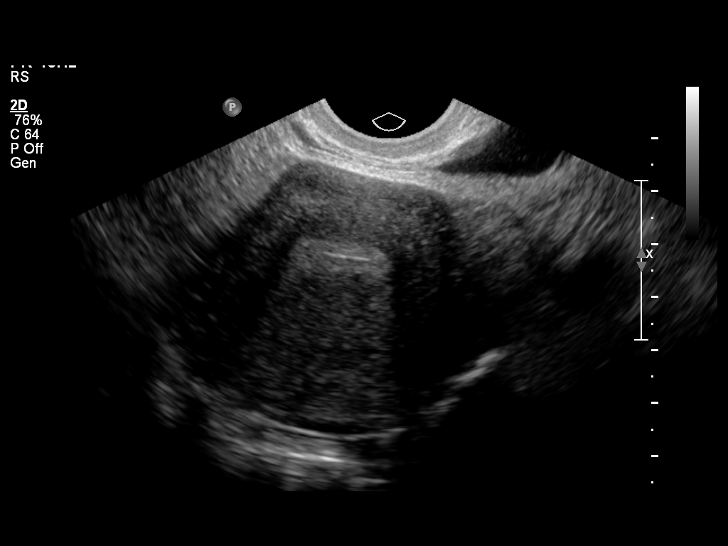
[im 22/35]
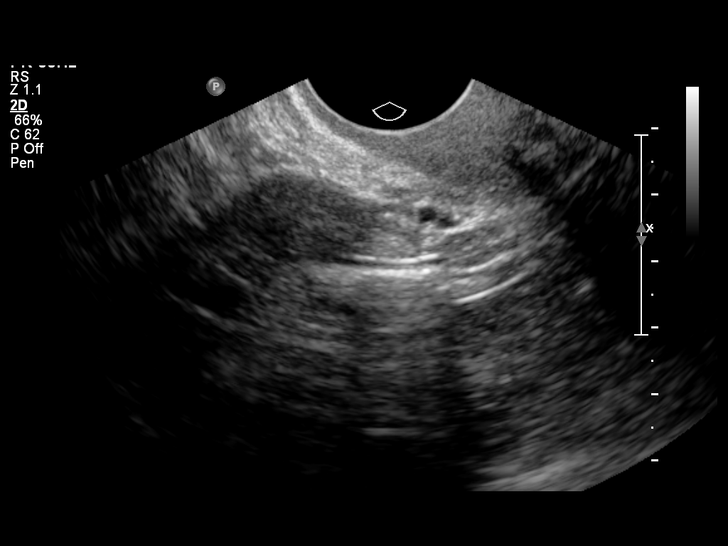
[im 24/35]
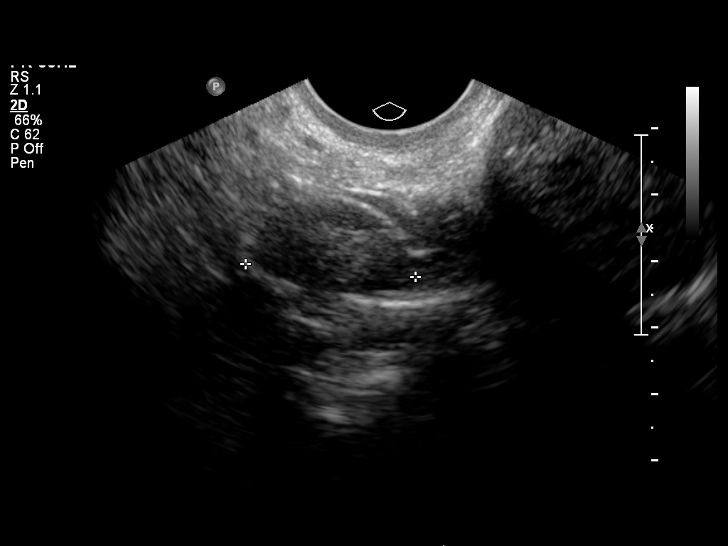
[im 27/35]
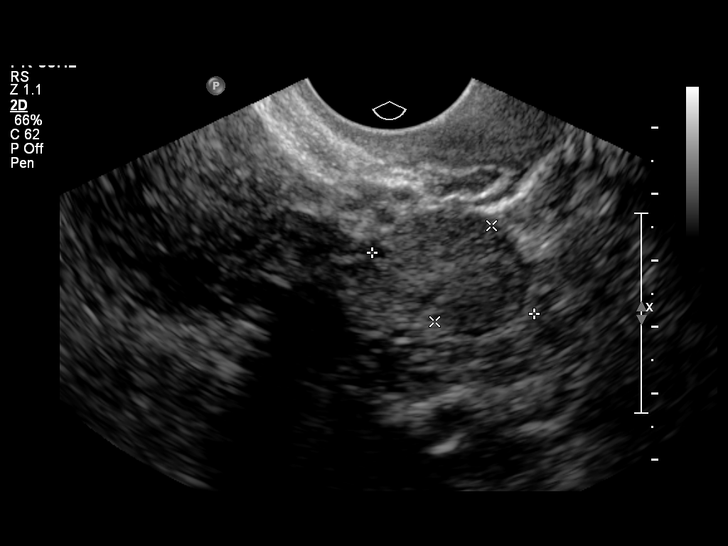
[im 29/35]
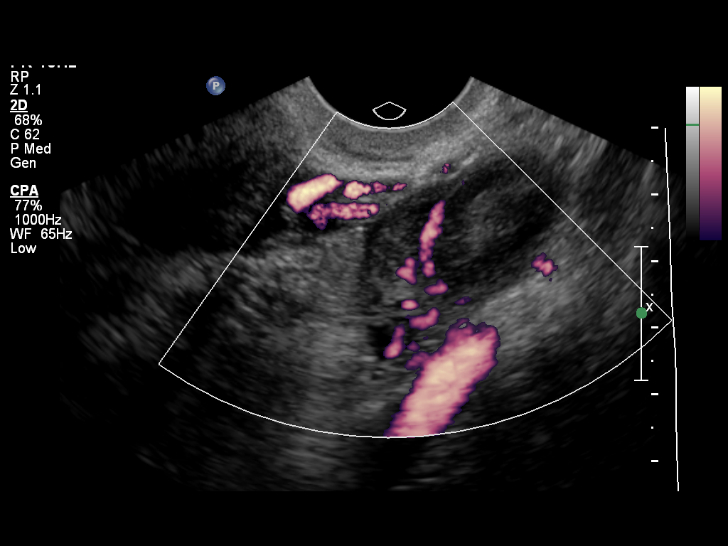
[im 32/35]
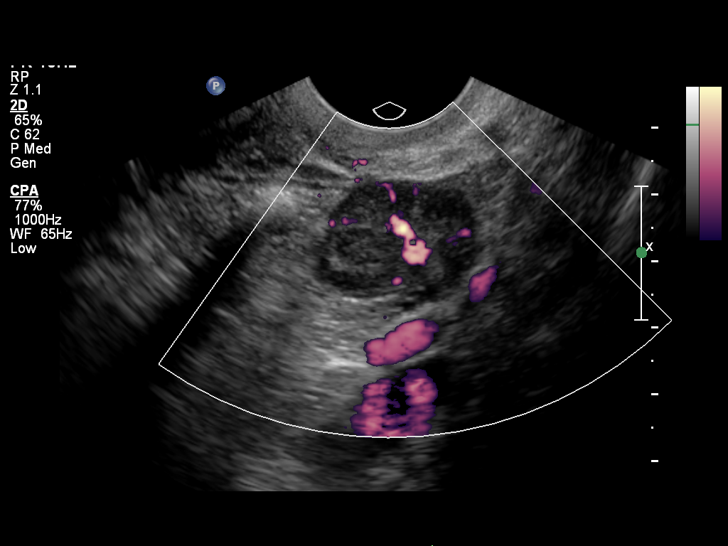
[im 35/35]
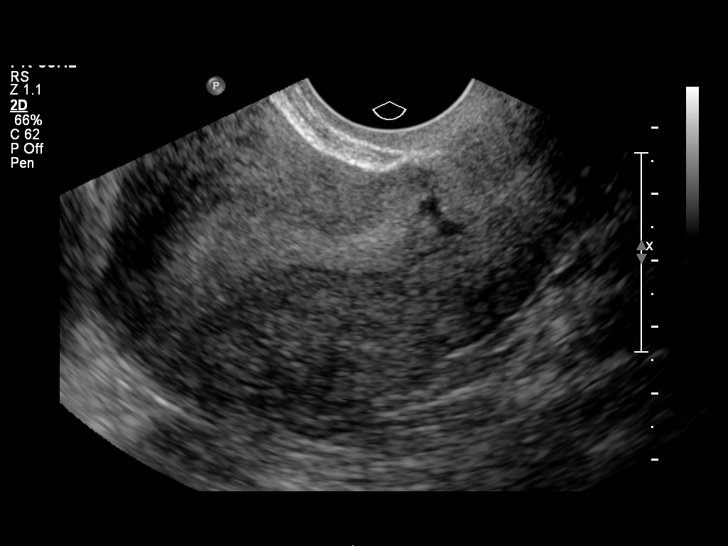

[14 of 28 positions shown; findings below may reference images not displayed]

FINDINGS: Intrauterine gestational sac: Not visualized

Yolk sac:  Not visualized

Embryo:  Not visualized

Maternal uterus/adnexae: Possible left corpus luteum. The right
ovary is unremarkable. No free fluid in the pelvis.
IMPRESSION: No definite intrauterine pregnancy identified.

In the setting of positive pregnancy test and no definite
intrauterine pregnancy, this reflects a pregnancy of unknown
location. Differential considerations include early normal IUP,
abnormal IUP, or nonvisualized ectopic pregnancy. Differentiation is
achieved with serial beta HCG supplemented by repeat sonography as
clinically warranted.

## 2015-11-12 ENCOUNTER — Ambulatory Visit (INDEPENDENT_AMBULATORY_CARE_PROVIDER_SITE_OTHER): Payer: Medicaid Other | Admitting: Obstetrics and Gynecology

## 2015-11-12 VITALS — BP 129/65 | HR 90 | Wt 201.7 lb

## 2015-11-12 DIAGNOSIS — Z98891 History of uterine scar from previous surgery: Secondary | ICD-10-CM

## 2015-11-12 DIAGNOSIS — O34219 Maternal care for unspecified type scar from previous cesarean delivery: Secondary | ICD-10-CM | POA: Diagnosis not present

## 2015-11-12 DIAGNOSIS — F1920 Other psychoactive substance dependence, uncomplicated: Secondary | ICD-10-CM | POA: Diagnosis not present

## 2015-11-12 DIAGNOSIS — O09522 Supervision of elderly multigravida, second trimester: Secondary | ICD-10-CM

## 2015-11-12 DIAGNOSIS — O09529 Supervision of elderly multigravida, unspecified trimester: Secondary | ICD-10-CM

## 2015-11-12 LAB — POCT URINALYSIS DIP (DEVICE)
Bilirubin Urine: NEGATIVE
Glucose, UA: NEGATIVE mg/dL
HGB URINE DIPSTICK: NEGATIVE
Ketones, ur: NEGATIVE mg/dL
LEUKOCYTES UA: NEGATIVE
Nitrite: NEGATIVE
PROTEIN: NEGATIVE mg/dL
SPECIFIC GRAVITY, URINE: 1.02 (ref 1.005–1.030)
UROBILINOGEN UA: 0.2 mg/dL (ref 0.0–1.0)
pH: 7 (ref 5.0–8.0)

## 2015-11-12 NOTE — Progress Notes (Signed)
U/S & genetic counseling rescheduled to 06/29 @ 745am

## 2015-11-12 NOTE — Progress Notes (Signed)
U/S and genetic counseling scheduled 07/11 @ 830am.

## 2015-11-12 NOTE — Progress Notes (Signed)
Subjective:  Kimberly Pace is a 40 y.o. [id number] at [redacted]w[redacted]d being seen today for ongoing prenatal care.  She is currently monitored for the following issues for this low-risk pregnancy and has Prior ectopic pregnancy, antepartum; Smoker; Obesity; Drug abuse and dependence ([place]); History of fetal anomaly in prior pregnancy, currently pregnant; Abortion, missed; Antepartum multigravida of advanced maternal age; and History of cesarean section on her problem list.  Patient reports no complaints.  Contractions: Not present.  .  Movement: Present. Denies leaking of fluid.   The following portions of the patient's history were reviewed and updated as appropriate: allergies, current medications, past family history, past medical history, past social history, past surgical history and problem list. Problem list updated.  Objective:   Filed Vitals:   11/12/15 0855  BP: 129/65  Pulse: 90  Weight: 201 lb 11.2 oz (91.491 kg)    Fetal Status: Fetal Heart Rate (bpm): 142   Movement: Present     General:  Alert, oriented and cooperative. Patient is in no acute distress.  Skin: Skin is warm and dry. No rash noted.   Cardiovascular: Normal heart rate noted  Respiratory: Normal respiratory effort, no problems with respiration noted  Abdomen: Soft, gravid, appropriate for gestational age. Pain/Pressure: Present     Pelvic: Cervical exam deferred        Extremities: Normal range of motion.  Edema: None  Mental Status: Normal mood and affect. Normal behavior. Normal judgment and thought content.   Urinalysis: Urine Protein: Negative Urine Glucose: Negative  Assessment and Plan:  Pregnancy: [id number] at [redacted]w[redacted]d  1. Antepartum multigravida of advanced maternal age, unspecified trimester - quad screen ordered  2. History of cesarean section - aware of need for repeat c/s given hx of 3  3. 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 Discussed w/ patient dangers of taking illicit opioids.  Preterm labor symptoms and general obstetric precautions including but not limited to vaginal bleeding, contractions, leaking of fluid and fetal movement were reviewed in detail with the patient. Please refer to After Visit Summary for other counseling recommendations.  F/u 4 wks  Gwynne Edinger, MD

## 2015-11-14 ENCOUNTER — Encounter (HOSPITAL_COMMUNITY): Payer: Self-pay | Admitting: Obstetrics and Gynecology

## 2015-11-15 ENCOUNTER — Encounter (HOSPITAL_COMMUNITY): Payer: Self-pay | Admitting: *Deleted

## 2015-11-15 ENCOUNTER — Inpatient Hospital Stay (HOSPITAL_COMMUNITY)
Admission: EM | Admit: 2015-11-15 | Discharge: 2015-11-15 | Disposition: A | Discharge status: Home / Self Care | Payer: Medicaid Other | Source: Ambulatory Visit | Attending: Obstetrics & Gynecology | Admitting: Obstetrics & Gynecology

## 2015-11-15 DIAGNOSIS — O99612 Diseases of the digestive system complicating pregnancy, second trimester: Secondary | ICD-10-CM | POA: Diagnosis not present

## 2015-11-15 DIAGNOSIS — Z3A18 18 weeks gestation of pregnancy: Secondary | ICD-10-CM | POA: Diagnosis not present

## 2015-11-15 DIAGNOSIS — O99342 Other mental disorders complicating pregnancy, second trimester: Secondary | ICD-10-CM | POA: Diagnosis not present

## 2015-11-15 DIAGNOSIS — O99332 Smoking (tobacco) complicating pregnancy, second trimester: Secondary | ICD-10-CM | POA: Insufficient documentation

## 2015-11-15 DIAGNOSIS — F419 Anxiety disorder, unspecified: Secondary | ICD-10-CM | POA: Insufficient documentation

## 2015-11-15 DIAGNOSIS — O26892 Other specified pregnancy related conditions, second trimester: Secondary | ICD-10-CM | POA: Insufficient documentation

## 2015-11-15 DIAGNOSIS — M797 Fibromyalgia: Secondary | ICD-10-CM | POA: Insufficient documentation

## 2015-11-15 DIAGNOSIS — F17210 Nicotine dependence, cigarettes, uncomplicated: Secondary | ICD-10-CM | POA: Insufficient documentation

## 2015-11-15 DIAGNOSIS — R109 Unspecified abdominal pain: Secondary | ICD-10-CM | POA: Diagnosis present

## 2015-11-15 DIAGNOSIS — K5900 Constipation, unspecified: Secondary | ICD-10-CM | POA: Insufficient documentation

## 2015-11-15 DIAGNOSIS — F1920 Other psychoactive substance dependence, uncomplicated: Secondary | ICD-10-CM

## 2015-11-15 LAB — URINALYSIS, ROUTINE W REFLEX MICROSCOPIC
BILIRUBIN URINE: NEGATIVE
GLUCOSE, UA: NEGATIVE mg/dL
Hgb urine dipstick: NEGATIVE
KETONES UR: NEGATIVE mg/dL
LEUKOCYTES UA: NEGATIVE
NITRITE: NEGATIVE
PROTEIN: NEGATIVE mg/dL
Specific Gravity, Urine: <1.005 — ABNORMAL LOW (ref 1.005–1.030)
pH: 6 (ref 5.0–8.0)

## 2015-11-15 MED ORDER — KETOROLAC TROMETHAMINE 60 MG/2ML IM SOLN
60.0000 mg | Freq: Once | INTRAMUSCULAR | Status: AC
  Administered 2015-11-15: 60 mg via INTRAMUSCULAR
  Filled 2015-11-15: qty 2

## 2015-11-15 MED ORDER — CYCLOBENZAPRINE HCL 10 MG PO TABS: 10.0000 mg | ORAL_TABLET | Freq: Once | ORAL | Status: DC

## 2015-11-15 MED ORDER — SERTRALINE HCL 50 MG PO TABS: 50.0000 mg | ORAL_TABLET | Freq: Every day | ORAL | Status: DC

## 2015-11-15 MED ORDER — HYDROXYZINE HCL 25 MG PO TABS
25.0000 mg | ORAL_TABLET | Freq: Once | ORAL | Status: AC
  Administered 2015-11-15: 25 mg via ORAL
  Filled 2015-11-15: qty 1

## 2015-11-15 MED ORDER — HYDROXYZINE HCL 25 MG PO TABS: 50.0000 mg | ORAL_TABLET | Freq: Three times a day (TID) | ORAL | Status: DC

## 2015-11-15 NOTE — MAU Note (Addendum)
C/O pain in legs, arms and shoulders, generalized all over. States she has fibromyalgia and thinks this pain is from that. States she has talked with a counselor in the [facility] to see how she can manage her pain. Was referred to [facility], but states she does not have transportation. States she is just "dying" with pain, can't sleep, causing a large amount of stress. States she has been trying to manage pain herself without success. Has tried Vicodin and Percocet. Has tried Subutex. Patient admits that none has been legally prescribed.

## 2015-11-15 NOTE — MAU Provider Note (Signed)
History     CSN: [id number]  Arrival date and time: 11/15/15 1404   First Provider Initiated Contact with Patient 11/15/15 1458      Chief Complaint  Patient presents with  . Abdominal Cramping   HPIpt is [redacted]w[redacted]d pregnant [id number] who presents with fibromyalgia pain- pt is not on any medications- has taken subutex that was not her prescription.  Pt states she has taken very little and is wanting to manage her pain with something that will not hurt th baby. Pt states she hurts all over- she has tried flexeril before but made her pain worse. Pt has been on zoloft before states she felt really weird.  Pt denies abd cramping, spotting or bleeding. Pt admits to constipation and is taking 3 stool softeners. Pt was seen recently in clinic and has an appointment at [facility] to help her manage her pain. Pt is also seeing a Education officer, museum who said she would help her arrange transportation. Pt is worried about her pain until her appointment. RN note:  Expand All Collapse All   C/O pain in legs, arms and shoulders, generalized all over. States she has fibromyalgia and thinks this pain is from that. States she has talked with a counselor in the [facility] to see how she can manage her pain. Was referred to [facility], but states she does not have transportation. States she is just "dying" with pain, can't sleep, causing a large amount of stress. States she has been trying to manage pain herself without success. Has tried Vicodin and Percocet. Has tried Subutex. Patient admits that none has been legally prescribed.       Past Medical History  Diagnosis Date  . Joint pain   . Fibromyalgia   . Anxiety     no meds since pregnancy  . Headache(784.0)     otc med prn  . Anemia     history with pregnancy  . Degenerative lumbar disc   . Back pain   . Hypertension     Past Surgical History  Procedure Laterality Date  . Back surgery    . Cesarean section      x 3  . Wisdom tooth  extraction    . Multiple tooth extractions      upper dneures and lower partial  . Dilation and evacuation N/A 05/06/2014    Procedure: DILATATION AND EVACUATION;  Surgeon: Donnamae Jude, MD;  Location: [facility];  Service: Gynecology;  Laterality: N/A;  . Dilation and curettage of uterus      Family History  Problem Relation Age of Onset  . Anesthesia problems Neg Hx   . Asthma Mother   . Hypertension Mother   . Diabetes Mother     Social History  Substance Use Topics  . Smoking status: Light Tobacco Smoker -- 0.25 packs/day for 15 years    Types: Cigarettes  . Smokeless tobacco: Never Used  . Alcohol Use: No     Comment: socially but none with pregnancy.      Allergies: No Known Allergies  Prescriptions prior to admission  Medication Sig Dispense Refill Last Dose  . docusate sodium (COLACE) 100 MG capsule Take 200 mg by mouth at bedtime.   11/14/2015 at Unknown time  . Prenatal Vit-Fe Fumarate-FA (GOODSENSE PRENATAL VITAMINS) 28-0.8 MG TABS Take 1 tablet by mouth at bedtime. 30 tablet 0 11/14/2015 at Unknown time    Review of Systems  Constitutional: Positive for chills and malaise/fatigue. Negative for  fever.  Eyes: Negative for blurred vision.  Cardiovascular: Negative for chest pain.  Gastrointestinal: Positive for nausea and constipation. Negative for vomiting and abdominal pain.  Genitourinary: Negative for dysuria and urgency.  Neurological: Positive for headaches. Negative for dizziness and tremors.  Psychiatric/Behavioral: Positive for substance abuse. Negative for depression and suicidal ideas. The patient is nervous/anxious and has insomnia.    Physical Exam   Blood pressure 134/51, pulse 110, temperature 98.7 F (37.1 C), temperature source Oral, resp. rate 18, last menstrual period 07/08/2015, unknown if currently breastfeeding.  Physical Exam  Nursing note and vitals reviewed. Constitutional: She is oriented to person, place, and time. She appears  well-developed and well-nourished.  HENT:  Head: Normocephalic.  Eyes: Pupils are equal, round, and reactive to light.  Neck: Normal range of motion.  Respiratory: Effort normal.  GI: Soft. She exhibits no distension. There is no tenderness. There is no rebound.  Musculoskeletal: Normal range of motion.  Neurological: She is alert and oriented to person, place, and time.  Skin: Skin is warm and dry.  Psychiatric: She has a normal mood and affect.    MAU Course  Procedures  Discussed with Dr. Ernestina Patches options for pt- ok to give Toradol at this phase of pregnancy Atarax 25 mg given PO- pt states she felt no improvement in pain Atarax 25mg  additional given with Toradol 60mg .IM Discussed with pt restarting Zoloft for pain until she sees OB in [place] refuses Buspar- states she tried that before and couldn't function Pt is very frustated with dealing with the pain Pt looked more comfortable after the atarax 50mg  and toradol  Assessment and Plan  Chronic pain/fibromyalgia in pregnancy- Rx Atarax 50mg  TID prn  Zoloft 50mg - one daily F/u with Education officer, museum and OB appointment at [facility] 11/15/2015, 2:58 PM

## 2015-11-16 LAB — AFP, QUAD SCREEN
AFP: 30 ng/mL
Age Alone: 1:142 {titer}
CURR GEST AGE: 18.1 wk
Down Syndrome Scr Risk Est: 1:336 {titer}
HCG, Total: 27.93 IU/mL
INH: 156.3 pg/mL
Interpretation-AFP: NEGATIVE
MOM FOR AFP: 0.8
MOM FOR HCG: 1.22
MOM FOR INH: 1.16
Open Spina bifida: NEGATIVE
Osb Risk: 1:27100 {titer}
Tri 18 Scr Risk Est: NEGATIVE
UE3 MOM: 0.95
UE3 VALUE: 1.15 ng/mL

## 2015-11-20 ENCOUNTER — Encounter: Payer: Self-pay | Admitting: Family Medicine

## 2015-11-22 ENCOUNTER — Encounter (HOSPITAL_COMMUNITY): Payer: Self-pay

## 2015-11-22 ENCOUNTER — Ambulatory Visit (HOSPITAL_COMMUNITY)
Admission: RE | Admit: 2015-11-22 | Discharge: 2015-11-22 | Disposition: A | Discharge status: Home / Self Care | Payer: Medicaid Other | Source: Ambulatory Visit | Attending: Obstetrics and Gynecology | Admitting: Obstetrics and Gynecology

## 2015-11-22 ENCOUNTER — Other Ambulatory Visit: Payer: Self-pay | Admitting: Obstetrics and Gynecology

## 2015-11-22 VITALS — BP 125/83 | HR 94 | Wt 204.4 lb

## 2015-11-22 DIAGNOSIS — O09292 Supervision of pregnancy with other poor reproductive or obstetric history, second trimester: Secondary | ICD-10-CM

## 2015-11-22 DIAGNOSIS — Z36 Encounter for antenatal screening of mother: Secondary | ICD-10-CM | POA: Diagnosis not present

## 2015-11-22 DIAGNOSIS — Z3689 Encounter for other specified antenatal screening: Secondary | ICD-10-CM

## 2015-11-22 DIAGNOSIS — O99322 Drug use complicating pregnancy, second trimester: Secondary | ICD-10-CM | POA: Insufficient documentation

## 2015-11-22 DIAGNOSIS — Z3A19 19 weeks gestation of pregnancy: Secondary | ICD-10-CM | POA: Insufficient documentation

## 2015-11-22 DIAGNOSIS — O99320 Drug use complicating pregnancy, unspecified trimester: Secondary | ICD-10-CM

## 2015-11-22 DIAGNOSIS — O09522 Supervision of elderly multigravida, second trimester: Secondary | ICD-10-CM | POA: Diagnosis not present

## 2015-11-22 DIAGNOSIS — Z98891 History of uterine scar from previous surgery: Secondary | ICD-10-CM

## 2015-11-22 DIAGNOSIS — O09529 Supervision of elderly multigravida, unspecified trimester: Secondary | ICD-10-CM

## 2015-11-22 DIAGNOSIS — F1120 Opioid dependence, uncomplicated: Secondary | ICD-10-CM

## 2015-11-22 DIAGNOSIS — O34219 Maternal care for unspecified type scar from previous cesarean delivery: Secondary | ICD-10-CM | POA: Insufficient documentation

## 2015-11-22 DIAGNOSIS — O09299 Supervision of pregnancy with other poor reproductive or obstetric history, unspecified trimester: Secondary | ICD-10-CM

## 2015-11-22 NOTE — Progress Notes (Signed)
Genetic Counseling  High-Risk Gestation Note  Appointment Date:  11/22/2015 Referred By: Kimberly Edinger, MD Date of Birth:  10-Nov-1975   Pregnancy History: [id number] Estimated Date of Delivery: 04/13/16 Estimated Gestational Age: 39w4dAttending: PBenjaman Lobe MD   Ms. Kimberly STREETwas seen for genetic counseling because of a maternal age of 40y.o. and for a history of previous child with infantile polycystic kidney disease. She was accompanied by a friend to today's visit.   In summary:   Discussed AMA and associated risk for fetal aneuploidy  Reviewed results of Quad screening, which reduced risks for fetal Down syndrome (1 in 336), Trisomy 18 (1 in 8760) and open spina bifida (1 in 27,100)  Reviewed sensitivity of Quad screening  Patient understands it is not diagnostic  Discussed options for additional screening  NIPS-declined  Ultrasound-performed today; complete results under separate cover  Discussed diagnostic testing options  Amniocentesis-declined  Reviewed family history concerns  First child, with different partner from current pregnancy, with polycystic kidneys and subsequent lung hypoplasia, died in infancy  Patient has had two subsequent healthy children (with different partners from first child)  Autosomal recessive polycystic kidney disease suspected as most likely diagnosis, but other diagnoses cannot be ruled out at this time   Reviewed autosomal recessive inheritance  Father of the current pregnancy estimated to have general population risk of ~ 1 in 63to be carrier, in which case recurrence risk is approximately 1 in 267for ARPKD  Carrier screening available for patient and father of the current pregnancy for PKHD1 gene (associated with ARPKD); patient declined at this time  Prenatal diagnosis would be available via amniocentesis if carrier couple and specific mutations were identified  Ultrasound is available in pregnancy to screen for  potential symptoms  Recurrence risk may be altered in case of different underlying cause  Discussed carrier screening options  CF-previously performed, negative for mutations screened  SMA-patient declined  Hemoglobinopathies-patient declined  She was counseled regarding maternal age and the association with risk for chromosome conditions due to nondisjunction with aging of the ova.  We reviewed chromosomes, nondisjunction, and the associated 1 in 746risk for fetal aneuploidy related to a maternal age of 40y.o. at 161w4destation.  She was counseled that the risk for aneuploidy decreases as gestational age increases, accounting for those pregnancies which spontaneously abort.  We specifically discussed Down syndrome (trisomy 2143 trisomies 132nd 1888and sex chromosome aneuploidies (47,XXX and 47,XXY) including the common features and prognoses of each.   We also reviewed Ms. Kimberly Pace''s maternal serum Quad screen result and the associated reduction in risks for fetal Down syndrome (1 in 142 to 1 in 336), trisomy 18 (1 in 8760), and ONTDs.  She understands that Quad screening provides a pregnancy specific risk for these conditions, but is not considered to be diagnostic. It also does not screen for all chromosome conditions and does not screen for single gene conditions.  We reviewed other available screening options including noninvasive prenatal screening (NIPS)/cell free DNA (cfDNA) screening and detailed ultrasound.  She was counseled that screening tests are used to modify a patient's a priori risk for aneuploidy, typically based on age. This estimate provides a pregnancy specific risk assessment. We reviewed the benefits and limitations of each option. Specifically, we discussed the conditions for which each test screens, the detection rates, and false positive rates of each. She was also counseled regarding diagnostic testing via amniocentesis. We reviewed the approximate 1  in 073-710  risk for complications from amniocentesis, including spontaneous pregnancy loss. We discussed the possible results that the tests might provide including: positive, negative, unanticipated, and no result. Finally, they were counseled regarding the cost of each option and potential out of pocket expenses.     A complete ultrasound was performed today. The ultrasound report will be sent under separate cover. There were no visualized fetal anomalies or markers suggestive of aneuploidy. Limited views of fetal heart obtained today. Follow-up ultrasound is scheduled for 12/20/15. After consideration of all the options, she declined NIPS and amniocentesis, indicating she was not interested in additional prenatal testing for fetal aneuploidy. She understands that screening tests cannot rule out all birth defects or genetic syndromes. The patient was advised of this limitation and states she still does not want additional testing at this time.   Kimberly Pace was provided with written information regarding cystic fibrosis (CF), spinal muscular atrophy (SMA) and hemoglobinopathies including the carrier frequency, availability of carrier screening and prenatal diagnosis if indicated.  In addition, we discussed that CF and hemoglobinopathies are routinely screened for as part of the [place] newborn screening panel. CF carrier screening was previously performed and was negative for mutations screened.  After further discussion, she declined screening for SMA and hemoglobinopathies.  Both family histories were reviewed and found to be contributory for polycystic kidneys and lung hypoplasia for the patient's first child. The patient reported her son, Kimberly Pace, born in [place], was diagnosed neonatally with polycystic kidneys and subsequent lung hypoplasia. He died in infancy due to lung hypoplasia. The patient reported that this was her only pregnancy with that partner. She is unsure if genetic testing was performed for her  son.  She has met with genetic counselors previously regarding this history and recalls discussion of likely autosomal recessive inheritance but also other possible forms of inheritance. No additional relatives were reported with kidney disease. The father of the current pregnancy has no known family history of polycystic kidney disease, is reportedly of [place] descent and has no known consanguinity to the patient.   We reviewed that this description is most suggestive of autosomal recessive polycystic kidney disease (ARPKD). ARPKD is a genetic condition, which occurs in ~1 in every 10,000 to 40,000 individuals.  The condition is characterized by both renal and liver disease, but other organ systems can be variably affected.  The majority of individuals with ARPKD classically present during the fetal or neonatal period with enlarged echogenic kidneys.  Renal disease is characterized by nephromegaly, hypertension, and renal dysfunction.  Greater than 50% of individuals with ARPKD have end stage renal disease (ESRD) within the first decade of life.  ESRD often requires kidney transplantation.  We discussed that fetal onset of the condition is associated with kidney dysfunction in utero, which causes oligohydramnios and pulmonary hypoplasia.  These infants have significant risk for early death secondary to respiratory distress.  In addition, we discussed that ARPKD causes hepatobiliary disease, which is characterized by hepatomegaly, splenomegaly, and progressive portal hypertension.  We discussed that ARPKD shows significant variability in the age of onset and the presenting clinical features.  We spent time reviewing the autosomal recessive inheritance of ARPKD.  We discussed that PKHD1 is the only gene known to be associated with ARPKD.  They were counseled that parents of a child with ARPKD are typically carriers of the condition.  A carrier refers to an individual who has one altered gene and one typical  functioning copy of the  gene.  Carriers of ARPKD are healthy and have no features of ARPKD. In the case of ARPKD, or other autosomal recessive condition, Kimberly Pace would be an obligate carrier. We reviewed that the general population carrier frequency of ARPKD is ~1 in 41, and this would be the chance for the father of the current pregnancy to be a carrier given reported family history information.  We discussed that given the family history, the population frequency of ARPKD, and the recessive inheritance, the risk for the fetus to have ARPKD is ~1 in 59 (0.4%).  In the case that Kimberly Pace's son had a different underlying condition, recurrence risk estimate would change.   We discussed that carrier screening is available to Kimberly Pace for [facility] and then subsequent carrier screening would be available to the father of the current pregnancy. We discussed the availability of prenatal diagnosis for ARPKD.  They understand that once familial gene mutations are known, prenatal diagnosis can be performed via amniocentesis.  Given that ARPKD can present during fetal development, we also discussed the availability of a detailed ultrasound at 18+ weeks gestation to assess fetal kidney development and function.  A repeat ultrasound at ~28+ weeks should be considered as well. Detailed ultrasound was performed today and visualized fetal anatomy was within normal range. A follow-up ultrasound is scheduled for 12/20/15. Kimberly Pace declined carrier screening for PKHD1 at this time. Kimberly Pace was counseled that there are a variety of genetic conditions with cystic renal disease, including autosomal dominant PKD (ADPKD).  We discussed that ADPKD typically has adult onset; however, 1-2% of patients present during the neonatal period, often with signs and symptoms indistinguishable from ARPKD.  For this reason, we discussed that the risk assessment and options discussed today depend on the accuracy of the reported history.   Further genetic counseling is warranted if additional information is discovered.  Additionally, Kimberly Pace reported a maternal uncle with either polio or multiple sclerosis. She reported that it affects his whole body and that it has been stable (not progressive) his entire life. We discussed that multiple sclerosis (MS) is generally thought to be a multifactorial condition, meaning that multiple genes and environmental factors are involved in its onset. The chance for recurrence for first degree relatives of an affected individual is estimated to be approximately 2-3% from birth and approximately 1% after age 58 years. In the case that his features are related to polio, we discussed that this would not increase the risk for other relatives. Without further information regarding the provided family history, an accurate genetic risk cannot be calculated. Further genetic counseling is warranted if more information is obtained.  Kimberly Pace denied exposure to environmental toxins or chemical agents. She denied the use of alcohol, tobacco or [place] drugs. She denied significant viral illnesses during the course of her pregnancy. Her medical and surgical histories were noncontributory.   I counseled Kimberly [place] regarding the above risks and available options.  The approximate face-to-face time with the genetic counselor was 40 minutes.  Chipper Oman, MS,  Certified Genetic Counselor 11/22/2015

## 2015-12-04 ENCOUNTER — Ambulatory Visit (HOSPITAL_COMMUNITY): Payer: Medicaid Other

## 2015-12-10 ENCOUNTER — Ambulatory Visit (INDEPENDENT_AMBULATORY_CARE_PROVIDER_SITE_OTHER): Payer: Medicaid Other | Admitting: Obstetrics & Gynecology

## 2015-12-10 VITALS — BP 128/82 | HR 83 | Wt 202.9 lb

## 2015-12-10 DIAGNOSIS — O09522 Supervision of elderly multigravida, second trimester: Secondary | ICD-10-CM | POA: Diagnosis present

## 2015-12-10 LAB — POCT URINALYSIS DIP (DEVICE)
BILIRUBIN URINE: NEGATIVE
Glucose, UA: NEGATIVE mg/dL
HGB URINE DIPSTICK: NEGATIVE
KETONES UR: NEGATIVE mg/dL
LEUKOCYTES UA: NEGATIVE
NITRITE: NEGATIVE
PH: 6.5 (ref 5.0–8.0)
Protein, ur: NEGATIVE mg/dL
SPECIFIC GRAVITY, URINE: 1.02 (ref 1.005–1.030)
UROBILINOGEN UA: 0.2 mg/dL (ref 0.0–1.0)

## 2015-12-10 NOTE — Progress Notes (Signed)
LM for Heather for NICU tour.  Pt notified that Nira Conn will contact her with an appt.  Pt agreed.

## 2015-12-10 NOTE — Progress Notes (Signed)
Subjective:  Kimberly Pace is a 40 y.o. [id number] at [redacted]w[redacted]d being seen today for ongoing prenatal care.  She is currently monitored for the following issues for this high-risk pregnancy and has Prior ectopic pregnancy, antepartum; Smoker; Obesity; Drug abuse and dependence ([place]); History of fetal anomaly in prior pregnancy, currently pregnant; Abortion, missed; Antepartum multigravida of advanced maternal age; and Previous cesarean section complicating pregnancy, antepartum condition or complication on her problem list.  Patient reports no complaints.  Contractions: Not present. Vag. Bleeding: None.  Movement: Present. Denies leaking of fluid.   The following portions of the patient's history were reviewed and updated as appropriate: allergies, current medications, past family history, past medical history, past social history, past surgical history and problem list. Problem list updated.  Objective:   Filed Vitals:   12/10/15 1113  BP: 128/82  Pulse: 83  Weight: 202 lb 14.4 oz (92.035 kg)    Fetal Status: Fetal Heart Rate (bpm): 146   Movement: Present     General:  Alert, oriented and cooperative. Patient is in no acute distress.  Skin: Skin is warm and dry. No rash noted.   Cardiovascular: Normal heart rate noted  Respiratory: Normal respiratory effort, no problems with respiration noted  Abdomen: Soft, gravid, appropriate for gestational age. Pain/Pressure: Present     Pelvic:  Cervical exam deferred        Extremities: Normal range of motion.  Edema: None  Mental Status: Normal mood and affect. Normal behavior. Normal judgment and thought content.   Urinalysis: Urine Protein: Negative Urine Glucose: Negative  Assessment and Plan:  Pregnancy: [id number] at [redacted]w[redacted]d  1. Antepartum multigravida of advanced maternal age, second trimester -BP nml, no issues  2.  Subutex for chronic pain -NAS visit with NICU.  Preterm labor symptoms and general obstetric precautions including but not  limited to vaginal bleeding, contractions, leaking of fluid and fetal movement were reviewed in detail with the patient. Please refer to After Visit Summary for other counseling recommendations.   RTC 4 weeks  Guss Bunde, MD

## 2015-12-10 NOTE — Progress Notes (Signed)
Pt reports going to [facility] for pain management, appts are every two weeks

## 2015-12-19 ENCOUNTER — Encounter (HOSPITAL_COMMUNITY): Payer: Self-pay

## 2015-12-20 ENCOUNTER — Encounter (HOSPITAL_COMMUNITY): Payer: Self-pay

## 2015-12-20 ENCOUNTER — Ambulatory Visit (HOSPITAL_COMMUNITY)
Admission: RE | Admit: 2015-12-20 | Discharge: 2015-12-20 | Disposition: A | Discharge status: Home / Self Care | Payer: Medicaid Other | Source: Ambulatory Visit | Attending: Obstetrics and Gynecology | Admitting: Obstetrics and Gynecology

## 2015-12-20 VITALS — BP 95/50 | HR 71 | Wt 204.4 lb

## 2015-12-20 DIAGNOSIS — O09522 Supervision of elderly multigravida, second trimester: Secondary | ICD-10-CM | POA: Insufficient documentation

## 2015-12-20 DIAGNOSIS — O34219 Maternal care for unspecified type scar from previous cesarean delivery: Secondary | ICD-10-CM

## 2015-12-20 DIAGNOSIS — F1920 Other psychoactive substance dependence, uncomplicated: Secondary | ICD-10-CM

## 2015-12-20 DIAGNOSIS — Z3A23 23 weeks gestation of pregnancy: Secondary | ICD-10-CM | POA: Insufficient documentation

## 2015-12-20 DIAGNOSIS — O99320 Drug use complicating pregnancy, unspecified trimester: Secondary | ICD-10-CM

## 2015-12-20 DIAGNOSIS — O99322 Drug use complicating pregnancy, second trimester: Secondary | ICD-10-CM | POA: Diagnosis not present

## 2015-12-20 DIAGNOSIS — O09292 Supervision of pregnancy with other poor reproductive or obstetric history, second trimester: Secondary | ICD-10-CM | POA: Diagnosis not present

## 2015-12-20 DIAGNOSIS — F1120 Opioid dependence, uncomplicated: Secondary | ICD-10-CM

## 2015-12-20 DIAGNOSIS — Z36 Encounter for antenatal screening of mother: Secondary | ICD-10-CM | POA: Diagnosis not present

## 2016-01-09 ENCOUNTER — Encounter (HOSPITAL_COMMUNITY): Payer: Self-pay

## 2016-01-09 NOTE — Progress Notes (Signed)
After multiple attempts was able to speak with Ms. Domingo Cocking about scheduling a NICU NAS tour. She reports that she has mixed feelings about scheduling the visit. States she has previous experience with a NICU admission 16 years ago and is not sure if she wants to "bring up old memories." Discussed what a NICU tour involves and the use of comfort care measures for the infant to avoid medication administration, if at all possible. Encouraged mom to discuss with her OB at upcoming visit and to call back when she is ready to schedule.

## 2016-01-11 ENCOUNTER — Encounter: Payer: Medicaid Other | Admitting: Family Medicine

## 2016-01-15 ENCOUNTER — Ambulatory Visit (INDEPENDENT_AMBULATORY_CARE_PROVIDER_SITE_OTHER): Payer: Medicaid Other | Admitting: Advanced Practice Midwife

## 2016-01-15 VITALS — BP 117/64 | HR 69 | Wt 205.9 lb

## 2016-01-15 DIAGNOSIS — F17210 Nicotine dependence, cigarettes, uncomplicated: Secondary | ICD-10-CM

## 2016-01-15 DIAGNOSIS — O0992 Supervision of high risk pregnancy, unspecified, second trimester: Secondary | ICD-10-CM

## 2016-01-15 DIAGNOSIS — O99612 Diseases of the digestive system complicating pregnancy, second trimester: Secondary | ICD-10-CM

## 2016-01-15 DIAGNOSIS — Z23 Encounter for immunization: Secondary | ICD-10-CM | POA: Diagnosis not present

## 2016-01-15 DIAGNOSIS — O99331 Smoking (tobacco) complicating pregnancy, first trimester: Secondary | ICD-10-CM

## 2016-01-15 DIAGNOSIS — K5900 Constipation, unspecified: Secondary | ICD-10-CM

## 2016-01-15 LAB — POCT URINALYSIS DIP (DEVICE)
Bilirubin Urine: NEGATIVE
GLUCOSE, UA: NEGATIVE mg/dL
Hgb urine dipstick: NEGATIVE
Ketones, ur: NEGATIVE mg/dL
Leukocytes, UA: NEGATIVE
NITRITE: NEGATIVE
PH: 6 (ref 5.0–8.0)
PROTEIN: NEGATIVE mg/dL
Specific Gravity, Urine: 1.02 (ref 1.005–1.030)
UROBILINOGEN UA: 1 mg/dL (ref 0.0–1.0)

## 2016-01-15 LAB — CBC
HCT: 31.2 % — ABNORMAL LOW (ref 35.0–45.0)
Hemoglobin: 10.4 g/dL — ABNORMAL LOW (ref 11.7–15.5)
MCH: 26.9 pg — AB (ref 27.0–33.0)
MCHC: 33.3 g/dL (ref 32.0–36.0)
MCV: 80.6 fL (ref 80.0–100.0)
MPV: 11.2 fL (ref 7.5–12.5)
PLATELETS: 256 10*3/uL (ref 140–400)
RBC: 3.87 MIL/uL (ref 3.80–5.10)
RDW: 14.9 % (ref 11.0–15.0)
WBC: 14.5 10*3/uL — ABNORMAL HIGH (ref 3.8–10.8)

## 2016-01-15 LAB — HIV ANTIBODY (ROUTINE TESTING W REFLEX): HIV: NONREACTIVE

## 2016-01-15 LAB — GLUCOSE TOLERANCE, 1 HOUR (50G) W/O FASTING: GLUCOSE, 1 HR, GESTATIONAL: 115 mg/dL (ref <140)

## 2016-01-15 MED ORDER — TETANUS-DIPHTH-ACELL PERTUSSIS 5-2.5-18.5 LF-MCG/0.5 IM SUSP
0.5000 mL | Freq: Once | INTRAMUSCULAR | Status: AC
  Administered 2016-01-15: 0.5 mL via INTRAMUSCULAR

## 2016-01-15 MED ORDER — NICOTINE 14 MG/24HR TD PT24: 14.0000 mg | MEDICATED_PATCH | Freq: Every day | TRANSDERMAL | 0 refills | Status: DC

## 2016-01-15 MED ORDER — DOCUSATE SODIUM 100 MG PO CAPS: 100.0000 mg | ORAL_CAPSULE | Freq: Two times a day (BID) | ORAL | 2 refills | Status: DC

## 2016-01-15 MED ORDER — POLYETHYLENE GLYCOL 3350 17 G PO PACK: 17.0000 g | PACK | ORAL | 3 refills | Status: DC | PRN

## 2016-01-15 NOTE — Progress Notes (Signed)
BTS Consent signed and faxed

## 2016-01-15 NOTE — Progress Notes (Signed)
Subjective:  Kimberly Pace is a 40 y.o. [id number] at [redacted]w[redacted]d being seen today for ongoing prenatal care.  She is currently monitored for the following issues for this high-risk pregnancy and has Prior ectopic pregnancy, antepartum; Smoker; Obesity; Drug abuse and dependence ([place]); History of fetal anomaly in prior pregnancy, currently pregnant; Abortion, missed; Antepartum multigravida of advanced maternal age; and Previous cesarean section complicating pregnancy, antepartum condition or complication on her problem list.  Patient reports constipation, desire to quit smoking.  Contractions: Not present. Vag. Bleeding: None.  Movement: Present. Denies leaking of fluid.   The following portions of the patient's history were reviewed and updated as appropriate: allergies, current medications, past family history, past medical history, past social history, past surgical history and problem list. Problem list updated.  Objective:   Vitals:   01/15/16 0813  BP: 117/64  Pulse: 69  Weight: 205 lb 14.4 oz (93.4 kg)    Fetal Status: Fetal Heart Rate (bpm): 141 Fundal Height: 27 cm Movement: Present     General:  Alert, oriented and cooperative. Patient is in no acute distress.  Skin: Skin is warm and dry. No rash noted.   Cardiovascular: Normal heart rate noted  Respiratory: Normal respiratory effort, no problems with respiration noted  Abdomen: Soft, gravid, appropriate for gestational age. Pain/Pressure: Present     Pelvic:  Cervical exam deferred        Extremities: Normal range of motion.  Edema: None  Mental Status: Normal mood and affect. Normal behavior. Normal judgment and thought content.   Urinalysis:      Assessment and Plan:  Pregnancy: [id number] at [redacted]w[redacted]d  1. Constipation during pregnancy in second trimester --Pt stopped colace and is taking Miralax PRN instead. She has made dietary changes and is eating more fiber and drinking more water.  Discussed using Colace 100 mg BID AND  Miralax daily PRN and keep up dietary changes. - polyethylene glycol (MIRALAX / GLYCOLAX) packet; Take 17 g by mouth as needed.  Dispense: 14 each; Refill: 3 - docusate sodium (COLACE) 100 MG capsule; Take 1 capsule (100 mg total) by mouth 2 (two) times daily. Reported on 12/10/2015  Dispense: 30 capsule; Refill: 2  2. Tobacco smoking affecting pregnancy in first trimester, antepartum --Pt quit during previous pregnancies but is having difficulty this time. Discussed risks of nicotine in pregnancy, risks of smoking and risks of patch use.  Pt motivated to quit desires to try patch for a short time to see if she can stop smoking.  Pt smoking ~ 10 cigarettes /day.  Printed materials on smoking cessation given. - nicotine (NICODERM CQ - DOSED IN MG/24 HOURS) 14 mg/24hr patch; Place 1 patch (14 mg total) onto the skin daily.  Dispense: 28 patch; Refill: 0  3. Supervision of high-risk pregnancy, second trimester  - Glucose Tolerance, 1 HR (50g) w/o Fasting - RPR - HIV antibody (with reflex) - CBC - Tdap (BOOSTRIX) injection 0.5 mL; Inject 0.5 mLs into the muscle once. - POCT urinalysis dip (device)  Preterm labor symptoms and general obstetric precautions including but not limited to vaginal bleeding, contractions, leaking of fluid and fetal movement were reviewed in detail with the patient. Please refer to After Visit Summary for other counseling recommendations.  Return in about 2 weeks (around 01/29/2016).   Elvera Maria, CNM

## 2016-01-15 NOTE — Patient Instructions (Signed)
Smoking Cessation, Tips for Success If you are ready to quit smoking, congratulations! You have chosen to help yourself be healthier. Cigarettes bring nicotine, tar, carbon monoxide, and other irritants into your body. Your lungs, heart, and blood vessels will be able to work better without these poisons. There are many different ways to quit smoking. Nicotine gum, nicotine patches, a nicotine inhaler, or nicotine nasal spray can help with physical craving. Hypnosis, support groups, and medicines help break the habit of smoking. WHAT THINGS CAN I DO TO MAKE QUITTING EASIER?  Here are some tips to help you quit for good:  Pick a date when you will quit smoking completely. Tell all of your friends and family about your plan to quit on that date.  Do not try to slowly cut down on the number of cigarettes you are smoking. Pick a quit date and quit smoking completely starting on that day.  Throw away all cigarettes.   Clean and remove all ashtrays from your home, work, and car.  On a card, write down your reasons for quitting. Carry the card with you and read it when you get the urge to smoke.  Cleanse your body of nicotine. Drink enough water and fluids to keep your urine clear or pale yellow. Do this after quitting to flush the nicotine from your body.  Learn to predict your moods. Do not let a bad situation be your excuse to have a cigarette. Some situations in your life might tempt you into wanting a cigarette.  Never have "just one" cigarette. It leads to wanting another and another. Remind yourself of your decision to quit.  Change habits associated with smoking. If you smoked while driving or when feeling stressed, try other activities to replace smoking. Stand up when drinking your coffee. Brush your teeth after eating. Sit in a different chair when you read the paper. Avoid alcohol while trying to quit, and try to drink fewer caffeinated beverages. Alcohol and caffeine may urge you to  smoke.  Avoid foods and drinks that can trigger a desire to smoke, such as sugary or spicy foods and alcohol.  Ask people who smoke not to smoke around you.  Have something planned to do right after eating or having a cup of coffee. For example, plan to take a walk or exercise.  Try a relaxation exercise to calm you down and decrease your stress. Remember, you may be tense and nervous for the first 2 weeks after you quit, but this will pass.  Find new activities to keep your hands busy. Play with a pen, coin, or rubber band. Doodle or draw things on paper.  Brush your teeth right after eating. This will help cut down on the craving for the taste of tobacco after meals. You can also try mouthwash.   Use oral substitutes in place of cigarettes. Try using lemon drops, carrots, cinnamon sticks, or chewing gum. Keep them handy so they are available when you have the urge to smoke.  When you have the urge to smoke, try deep breathing.  Designate your home as a nonsmoking area.  If you are a heavy smoker, ask your health care provider about a prescription for nicotine chewing gum. It can ease your withdrawal from nicotine.  Reward yourself. Set aside the cigarette money you save and buy yourself something nice.  Look for support from others. Join a support group or smoking cessation program. Ask someone at home or at work to help you with your plan   to quit smoking.  Always ask yourself, "Do I need this cigarette or is this just a reflex?" Tell yourself, "Today, I choose not to smoke," or "I do not want to smoke." You are reminding yourself of your decision to quit.  Do not replace cigarette smoking with electronic cigarettes (commonly called e-cigarettes). The safety of e-cigarettes is unknown, and some may contain harmful chemicals.  If you relapse, do not give up! Plan ahead and think about what you will do the next time you get the urge to smoke. HOW WILL I FEEL WHEN I QUIT SMOKING? You  may have symptoms of withdrawal because your body is used to nicotine (the addictive substance in cigarettes). You may crave cigarettes, be irritable, feel very hungry, cough often, get headaches, or have difficulty concentrating. The withdrawal symptoms are only temporary. They are strongest when you first quit but will go away within 10-14 days. When withdrawal symptoms occur, stay in control. Think about your reasons for quitting. Remind yourself that these are signs that your body is healing and getting used to being without cigarettes. Remember that withdrawal symptoms are easier to treat than the major diseases that smoking can cause.  Even after the withdrawal is over, expect periodic urges to smoke. However, these cravings are generally short lived and will go away whether you smoke or not. Do not smoke! WHAT RESOURCES ARE AVAILABLE TO HELP ME QUIT SMOKING? Your health care provider can direct you to community resources or hospitals for support, which may include:  Group support.  Education.  Hypnosis.  Therapy.   This information is not intended to replace advice given to you by your health care provider. Make sure you discuss any questions you have with your health care provider.   Document Released: 02/08/2004 Document Revised: 06/02/2014 Document Reviewed: 10/28/2012 Elsevier Interactive Patient Education 2016 [employer]  

## 2016-01-15 NOTE — Progress Notes (Signed)
Pt c/o constipation.  Colace was not effective. Currently taking Miralax only when she needs to go to the bathroom.  Educated pt on [place]  28 wk labs today  28 wk packet given

## 2016-01-16 LAB — RPR

## 2016-01-17 ENCOUNTER — Encounter (HOSPITAL_COMMUNITY): Payer: Self-pay

## 2016-01-17 ENCOUNTER — Ambulatory Visit (HOSPITAL_COMMUNITY)
Admission: RE | Admit: 2016-01-17 | Discharge: 2016-01-17 | Disposition: A | Discharge status: Home / Self Care | Payer: Medicaid Other | Source: Ambulatory Visit | Attending: Obstetrics and Gynecology | Admitting: Obstetrics and Gynecology

## 2016-01-17 DIAGNOSIS — O34219 Maternal care for unspecified type scar from previous cesarean delivery: Secondary | ICD-10-CM | POA: Diagnosis not present

## 2016-01-17 DIAGNOSIS — F1920 Other psychoactive substance dependence, uncomplicated: Secondary | ICD-10-CM

## 2016-01-17 DIAGNOSIS — O09522 Supervision of elderly multigravida, second trimester: Secondary | ICD-10-CM | POA: Insufficient documentation

## 2016-01-17 DIAGNOSIS — Z3A27 27 weeks gestation of pregnancy: Secondary | ICD-10-CM | POA: Diagnosis not present

## 2016-01-17 DIAGNOSIS — Z36 Encounter for antenatal screening of mother: Secondary | ICD-10-CM | POA: Insufficient documentation

## 2016-01-17 DIAGNOSIS — O99322 Drug use complicating pregnancy, second trimester: Secondary | ICD-10-CM | POA: Diagnosis not present

## 2016-01-17 DIAGNOSIS — O09292 Supervision of pregnancy with other poor reproductive or obstetric history, second trimester: Secondary | ICD-10-CM | POA: Insufficient documentation

## 2016-02-05 ENCOUNTER — Ambulatory Visit (INDEPENDENT_AMBULATORY_CARE_PROVIDER_SITE_OTHER): Payer: Medicaid Other | Admitting: Advanced Practice Midwife

## 2016-02-05 VITALS — BP 96/43 | HR 89 | Wt 204.9 lb

## 2016-02-05 DIAGNOSIS — O09523 Supervision of elderly multigravida, third trimester: Secondary | ICD-10-CM

## 2016-02-05 DIAGNOSIS — Z720 Tobacco use: Secondary | ICD-10-CM

## 2016-02-05 DIAGNOSIS — Z23 Encounter for immunization: Secondary | ICD-10-CM

## 2016-02-05 DIAGNOSIS — O34219 Maternal care for unspecified type scar from previous cesarean delivery: Secondary | ICD-10-CM

## 2016-02-05 DIAGNOSIS — O2613 Low weight gain in pregnancy, third trimester: Secondary | ICD-10-CM

## 2016-02-05 DIAGNOSIS — O2610 Low weight gain in pregnancy, unspecified trimester: Secondary | ICD-10-CM | POA: Insufficient documentation

## 2016-02-05 DIAGNOSIS — F17200 Nicotine dependence, unspecified, uncomplicated: Secondary | ICD-10-CM

## 2016-02-05 LAB — POCT URINALYSIS DIP (DEVICE)
Bilirubin Urine: NEGATIVE
GLUCOSE, UA: NEGATIVE mg/dL
Hgb urine dipstick: NEGATIVE
Ketones, ur: NEGATIVE mg/dL
Leukocytes, UA: NEGATIVE
Nitrite: NEGATIVE
PROTEIN: NEGATIVE mg/dL
SPECIFIC GRAVITY, URINE: 1.015 (ref 1.005–1.030)
UROBILINOGEN UA: 0.2 mg/dL (ref 0.0–1.0)
pH: 6.5 (ref 5.0–8.0)

## 2016-02-05 NOTE — Patient Instructions (Signed)
Fetal Movement Counts  Patient Name: __________________________________________________ Patient Due Date: ____________________  Performing a fetal movement count is highly recommended in high-risk pregnancies, but it is good for every pregnant woman to do. Your health care provider may ask you to start counting fetal movements at 28 weeks of the pregnancy. Fetal movements often increase:  · After eating a full meal.  · After physical activity.  · After eating or drinking something sweet or cold.  · At rest.  Pay attention to when you feel the baby is most active. This will help you notice a pattern of your baby's sleep and wake cycles and what factors contribute to an increase in fetal movement. It is important to perform a fetal movement count at the same time each day when your baby is normally most active.   HOW TO COUNT FETAL MOVEMENTS  1. Find a quiet and comfortable area to sit or lie down on your left side. Lying on your left side provides the best blood and oxygen circulation to your baby.  2. Write down the day and time on a sheet of paper or in a journal.  3. Start counting kicks, flutters, swishes, rolls, or jabs in a 2-hour period. You should feel at least 10 movements within 2 hours.  4. If you do not feel 10 movements in 2 hours, wait 2-3 hours and count again. Look for a change in the pattern or not enough counts in 2 hours.  SEEK MEDICAL CARE IF:  · You feel less than 10 counts in 2 hours, tried twice.  · There is no movement in over an hour.  · The pattern is changing or taking longer each day to reach 10 counts in 2 hours.  · You feel the baby is not moving as he or she usually does.  Date: ____________ Movements: ____________ Start time: ____________ Finish time: ____________   Date: ____________ Movements: ____________ Start time: ____________ Finish time: ____________  Date: ____________ Movements: ____________ Start time: ____________ Finish time: ____________  Date: ____________ Movements:  ____________ Start time: ____________ Finish time: ____________  Date: ____________ Movements: ____________ Start time: ____________ Finish time: ____________  Date: ____________ Movements: ____________ Start time: ____________ Finish time: ____________  Date: ____________ Movements: ____________ Start time: ____________ Finish time: ____________  Date: ____________ Movements: ____________ Start time: ____________ Finish time: ____________   Date: ____________ Movements: ____________ Start time: ____________ Finish time: ____________  Date: ____________ Movements: ____________ Start time: ____________ Finish time: ____________  Date: ____________ Movements: ____________ Start time: ____________ Finish time: ____________  Date: ____________ Movements: ____________ Start time: ____________ Finish time: ____________  Date: ____________ Movements: ____________ Start time: ____________ Finish time: ____________  Date: ____________ Movements: ____________ Start time: ____________ Finish time: ____________  Date: ____________ Movements: ____________ Start time: ____________ Finish time: ____________   Date: ____________ Movements: ____________ Start time: ____________ Finish time: ____________  Date: ____________ Movements: ____________ Start time: ____________ Finish time: ____________  Date: ____________ Movements: ____________ Start time: ____________ Finish time: ____________  Date: ____________ Movements: ____________ Start time: ____________ Finish time: ____________  Date: ____________ Movements: ____________ Start time: ____________ Finish time: ____________  Date: ____________ Movements: ____________ Start time: ____________ Finish time: ____________  Date: ____________ Movements: ____________ Start time: ____________ Finish time: ____________   Date: ____________ Movements: ____________ Start time: ____________ Finish time: ____________  Date: ____________ Movements: ____________ Start time: ____________ Finish  time: ____________  Date: ____________ Movements: ____________ Start time: ____________ Finish time: ____________  Date: ____________ Movements: ____________ Start time:   ____________ Finish time: ____________  Date: ____________ Movements: ____________ Start time: ____________ Finish time: ____________  Date: ____________ Movements: ____________ Start time: ____________ Finish time: ____________  Date: ____________ Movements: ____________ Start time: ____________ Finish time: ____________   Date: ____________ Movements: ____________ Start time: ____________ Finish time: ____________  Date: ____________ Movements: ____________ Start time: ____________ Finish time: ____________  Date: ____________ Movements: ____________ Start time: ____________ Finish time: ____________  Date: ____________ Movements: ____________ Start time: ____________ Finish time: ____________  Date: ____________ Movements: ____________ Start time: ____________ Finish time: ____________  Date: ____________ Movements: ____________ Start time: ____________ Finish time: ____________  Date: ____________ Movements: ____________ Start time: ____________ Finish time: ____________   Date: ____________ Movements: ____________ Start time: ____________ Finish time: ____________  Date: ____________ Movements: ____________ Start time: ____________ Finish time: ____________  Date: ____________ Movements: ____________ Start time: ____________ Finish time: ____________  Date: ____________ Movements: ____________ Start time: ____________ Finish time: ____________  Date: ____________ Movements: ____________ Start time: ____________ Finish time: ____________  Date: ____________ Movements: ____________ Start time: ____________ Finish time: ____________  Date: ____________ Movements: ____________ Start time: ____________ Finish time: ____________   Date: ____________ Movements: ____________ Start time: ____________ Finish time: ____________  Date: ____________  Movements: ____________ Start time: ____________ Finish time: ____________  Date: ____________ Movements: ____________ Start time: ____________ Finish time: ____________  Date: ____________ Movements: ____________ Start time: ____________ Finish time: ____________  Date: ____________ Movements: ____________ Start time: ____________ Finish time: ____________  Date: ____________ Movements: ____________ Start time: ____________ Finish time: ____________  Date: ____________ Movements: ____________ Start time: ____________ Finish time: ____________   Date: ____________ Movements: ____________ Start time: ____________ Finish time: ____________  Date: ____________ Movements: ____________ Start time: ____________ Finish time: ____________  Date: ____________ Movements: ____________ Start time: ____________ Finish time: ____________  Date: ____________ Movements: ____________ Start time: ____________ Finish time: ____________  Date: ____________ Movements: ____________ Start time: ____________ Finish time: ____________  Date: ____________ Movements: ____________ Start time: ____________ Finish time: ____________     This information is not intended to replace advice given to you by your health care provider. Make sure you discuss any questions you have with your health care provider.     Document Released: 06/11/2006 Document Revised: 06/02/2014 Document Reviewed: 03/08/2012  Elsevier Interactive Patient Education ©2016 [employer]

## 2016-02-05 NOTE — Progress Notes (Signed)
Flu vaccine today 

## 2016-02-05 NOTE — Progress Notes (Signed)
   PRENATAL VISIT NOTE  Subjective:  Kimberly Pace is a 40 y.o. [id number] at [redacted]w[redacted]d being seen today for ongoing prenatal care.  She is currently monitored for the following issues for this high-risk pregnancy and has Prior ectopic pregnancy, antepartum; Smoker; Obesity; Drug abuse and dependence ([place]); History of fetal anomaly in prior pregnancy, currently pregnant; Antepartum multigravida of advanced maternal age; Previous cesarean section complicating pregnancy, antepartum condition or complication; and Poor weight gain of pregnancy on her problem list.  Patient reports no complaints.  Contractions: Not present. Vag. Bleeding: None.  Movement: Present. Denies leaking of fluid.   The following portions of the patient's history were reviewed and updated as appropriate: allergies, current medications, past family history, past medical history, past social history, past surgical history and problem list. Problem list updated.  Objective:   Vitals:   02/05/16 1056  BP: (!) 96/43  Pulse: 89  Weight: 204 lb 14.4 oz (92.9 kg)    Fetal Status: Fetal Heart Rate (bpm): 130 Fundal Height: 30 cm Movement: Present     General:  Alert, oriented and cooperative. Patient is in no acute distress.  Skin: Skin is warm and dry. No rash noted.   Cardiovascular: Normal heart rate noted  Respiratory: Normal respiratory effort, no problems with respiration noted  Abdomen: Soft, gravid, appropriate for gestational age. Pain/Pressure: Present     Pelvic:  Cervical exam deferred        Extremities: Normal range of motion.  Edema: None  Mental Status: Normal mood and affect. Normal behavior. Normal judgment and thought content.   Urinalysis: Urine Protein: Negative Urine Glucose: Negative  Assessment and Plan:  Pregnancy: [id number] at [redacted]w[redacted]d  1. Antepartum multigravida of advanced maternal age, third trimester   2. Previous cesarean section complicating pregnancy, antepartum condition or  complication   3. Poor weight gain of pregnancy, third trimester   4. Smoker   Preterm labor symptoms and general obstetric precautions including but not limited to vaginal bleeding, contractions, leaking of fluid and fetal movement were reviewed in detail with the patient. Please refer to After Visit Summary for other counseling recommendations.  Requested C/S w/ BTL at 39 weeks. Return in about 2 weeks (around 02/19/2016) for ROB.  Manya Silvas, CNM

## 2016-02-07 ENCOUNTER — Encounter (HOSPITAL_COMMUNITY): Payer: Self-pay | Admitting: *Deleted

## 2016-02-14 ENCOUNTER — Encounter (HOSPITAL_COMMUNITY): Payer: Self-pay

## 2016-02-14 ENCOUNTER — Ambulatory Visit (HOSPITAL_COMMUNITY)
Admission: RE | Admit: 2016-02-14 | Discharge: 2016-02-14 | Disposition: A | Discharge status: Home / Self Care | Payer: Medicaid Other | Source: Ambulatory Visit | Attending: Obstetrics and Gynecology | Admitting: Obstetrics and Gynecology

## 2016-02-14 DIAGNOSIS — O34219 Maternal care for unspecified type scar from previous cesarean delivery: Secondary | ICD-10-CM | POA: Diagnosis not present

## 2016-02-14 DIAGNOSIS — Z3A31 31 weeks gestation of pregnancy: Secondary | ICD-10-CM | POA: Insufficient documentation

## 2016-02-14 DIAGNOSIS — O99323 Drug use complicating pregnancy, third trimester: Secondary | ICD-10-CM | POA: Insufficient documentation

## 2016-02-14 DIAGNOSIS — O09523 Supervision of elderly multigravida, third trimester: Secondary | ICD-10-CM | POA: Insufficient documentation

## 2016-02-14 DIAGNOSIS — F1920 Other psychoactive substance dependence, uncomplicated: Secondary | ICD-10-CM

## 2016-02-14 DIAGNOSIS — O09293 Supervision of pregnancy with other poor reproductive or obstetric history, third trimester: Secondary | ICD-10-CM | POA: Diagnosis present

## 2016-02-15 ENCOUNTER — Other Ambulatory Visit (HOSPITAL_COMMUNITY): Payer: Self-pay | Admitting: Obstetrics and Gynecology

## 2016-02-15 DIAGNOSIS — F1120 Opioid dependence, uncomplicated: Secondary | ICD-10-CM

## 2016-02-15 DIAGNOSIS — O09523 Supervision of elderly multigravida, third trimester: Secondary | ICD-10-CM

## 2016-02-15 DIAGNOSIS — O99323 Drug use complicating pregnancy, third trimester: Secondary | ICD-10-CM

## 2016-02-15 DIAGNOSIS — Z3A36 36 weeks gestation of pregnancy: Secondary | ICD-10-CM

## 2016-02-20 ENCOUNTER — Telehealth: Payer: Self-pay | Admitting: *Deleted

## 2016-02-20 NOTE — Telephone Encounter (Signed)
Pt left message on 9/25 @ 1326 requesting refill of Nicotine patches.

## 2016-02-22 ENCOUNTER — Encounter: Payer: Medicaid Other | Admitting: Family Medicine

## 2016-02-27 NOTE — Telephone Encounter (Signed)
According to note on 01/14/2016 Dr.Arnold gave the patient the nicotine patches.

## 2016-02-29 ENCOUNTER — Other Ambulatory Visit: Payer: Self-pay

## 2016-02-29 MED ORDER — NICOTINE 14 MG/24HR TD PT24: 14.0000 mg | MEDICATED_PATCH | Freq: Every day | TRANSDERMAL | 0 refills | Status: DC

## 2016-02-29 NOTE — Telephone Encounter (Signed)
Per Dr.Arnold ok  to give more rx for Nicotine patches. Patient was advised that we normally do not prescribed this medications. Patient verbalizes understanding at this time.

## 2016-03-13 ENCOUNTER — Encounter (HOSPITAL_COMMUNITY): Payer: Self-pay

## 2016-03-13 ENCOUNTER — Ambulatory Visit (HOSPITAL_COMMUNITY)
Admission: RE | Admit: 2016-03-13 | Discharge: 2016-03-13 | Disposition: A | Discharge status: Home / Self Care | Payer: Medicaid Other | Source: Ambulatory Visit | Attending: Obstetrics and Gynecology | Admitting: Obstetrics and Gynecology

## 2016-03-13 DIAGNOSIS — O09523 Supervision of elderly multigravida, third trimester: Secondary | ICD-10-CM | POA: Diagnosis not present

## 2016-03-13 DIAGNOSIS — O99323 Drug use complicating pregnancy, third trimester: Secondary | ICD-10-CM | POA: Diagnosis not present

## 2016-03-13 DIAGNOSIS — F1910 Other psychoactive substance abuse, uncomplicated: Secondary | ICD-10-CM | POA: Diagnosis not present

## 2016-03-13 DIAGNOSIS — O09293 Supervision of pregnancy with other poor reproductive or obstetric history, third trimester: Secondary | ICD-10-CM | POA: Diagnosis present

## 2016-03-13 DIAGNOSIS — O34219 Maternal care for unspecified type scar from previous cesarean delivery: Secondary | ICD-10-CM | POA: Diagnosis not present

## 2016-03-13 DIAGNOSIS — F1120 Opioid dependence, uncomplicated: Secondary | ICD-10-CM

## 2016-03-13 DIAGNOSIS — Z3A35 35 weeks gestation of pregnancy: Secondary | ICD-10-CM | POA: Diagnosis not present

## 2016-03-13 DIAGNOSIS — Z3A36 36 weeks gestation of pregnancy: Secondary | ICD-10-CM

## 2016-03-25 ENCOUNTER — Encounter: Payer: Medicaid Other | Admitting: Obstetrics and Gynecology

## 2016-03-27 ENCOUNTER — Ambulatory Visit (INDEPENDENT_AMBULATORY_CARE_PROVIDER_SITE_OTHER): Payer: Medicaid Other | Admitting: Family Medicine

## 2016-03-27 ENCOUNTER — Other Ambulatory Visit (HOSPITAL_COMMUNITY)
Admission: RE | Admit: 2016-03-27 | Discharge: 2016-03-27 | Disposition: A | Discharge status: Home / Self Care | Payer: Medicaid Other | Source: Ambulatory Visit | Attending: Family Medicine | Admitting: Family Medicine

## 2016-03-27 ENCOUNTER — Telehealth (HOSPITAL_COMMUNITY): Payer: Self-pay | Admitting: *Deleted

## 2016-03-27 VITALS — BP 119/71 | HR 90 | Wt 216.0 lb

## 2016-03-27 DIAGNOSIS — Z113 Encounter for screening for infections with a predominantly sexual mode of transmission: Secondary | ICD-10-CM | POA: Insufficient documentation

## 2016-03-27 DIAGNOSIS — O0913 Supervision of pregnancy with history of ectopic or molar pregnancy, third trimester: Secondary | ICD-10-CM

## 2016-03-27 DIAGNOSIS — O99323 Drug use complicating pregnancy, third trimester: Secondary | ICD-10-CM

## 2016-03-27 DIAGNOSIS — O34219 Maternal care for unspecified type scar from previous cesarean delivery: Secondary | ICD-10-CM

## 2016-03-27 DIAGNOSIS — O0910 Supervision of pregnancy with history of ectopic or molar pregnancy, unspecified trimester: Secondary | ICD-10-CM

## 2016-03-27 DIAGNOSIS — F1920 Other psychoactive substance dependence, uncomplicated: Secondary | ICD-10-CM

## 2016-03-27 LAB — POCT URINALYSIS DIP (DEVICE)
Bilirubin Urine: NEGATIVE
Glucose, UA: NEGATIVE mg/dL
HGB URINE DIPSTICK: NEGATIVE
KETONES UR: NEGATIVE mg/dL
Leukocytes, UA: NEGATIVE
Nitrite: NEGATIVE
PH: 6.5 (ref 5.0–8.0)
PROTEIN: NEGATIVE mg/dL
SPECIFIC GRAVITY, URINE: 1.02 (ref 1.005–1.030)
Urobilinogen, UA: 1 mg/dL (ref 0.0–1.0)

## 2016-03-27 LAB — OB RESULTS CONSOLE GBS: GBS: NEGATIVE

## 2016-03-27 NOTE — Patient Instructions (Signed)
Third Trimester of Pregnancy The third trimester is from week 29 through week 42, months 7 through 9. The third trimester is a time when the fetus is growing rapidly. At the end of the ninth month, the fetus is about 20 inches in length and weighs 6-10 pounds.  BODY CHANGES Your body goes through many changes during pregnancy. The changes vary from woman to woman.   Your weight will continue to increase. You can expect to gain 25-35 pounds (11-16 kg) by the end of the pregnancy.  You may begin to get stretch marks on your hips, abdomen, and breasts.  You may urinate more often because the fetus is moving lower into your pelvis and pressing on your bladder.  You may develop or continue to have heartburn as a result of your pregnancy.  You may develop constipation because certain hormones are causing the muscles that push waste through your intestines to slow down.  You may develop hemorrhoids or swollen, bulging veins (varicose veins).  You may have pelvic pain because of the weight gain and pregnancy hormones relaxing your joints between the bones in your pelvis. Backaches may result from overexertion of the muscles supporting your posture.  You may have changes in your hair. These can include thickening of your hair, rapid growth, and changes in texture. Some women also have hair loss during or after pregnancy, or hair that feels dry or thin. Your hair will most likely return to normal after your baby is born.  Your breasts will continue to grow and be tender. A yellow discharge may leak from your breasts called colostrum.  Your belly button may stick out.  You may feel short of breath because of your expanding uterus.  You may notice the fetus "dropping," or moving lower in your abdomen.  You may have a bloody mucus discharge. This usually occurs a few days to a week before labor begins.  Your cervix becomes thin and soft (effaced) near your due date. WHAT TO EXPECT AT YOUR  PRENATAL EXAMS  You will have prenatal exams every 2 weeks until week 36. Then, you will have weekly prenatal exams. During a routine prenatal visit:  You will be weighed to make sure you and the fetus are growing normally.  Your blood pressure is taken.  Your abdomen will be measured to track your baby's growth.  The fetal heartbeat will be listened to.  Any test results from the previous visit will be discussed.  You may have a cervical check near your due date to see if you have effaced. At around 36 weeks, your caregiver will check your cervix. At the same time, your caregiver will also perform a test on the secretions of the vaginal tissue. This test is to determine if a type of bacteria, Group B streptococcus, is present. Your caregiver will explain this further. Your caregiver may ask you:  What your birth plan is.  How you are feeling.  If you are feeling the baby move.  If you have had any abnormal symptoms, such as leaking fluid, bleeding, severe headaches, or abdominal cramping.  If you are using any tobacco products, including cigarettes, chewing tobacco, and electronic cigarettes.  If you have any questions. Other tests or screenings that may be performed during your third trimester include:  Blood tests that check for low iron levels (anemia).  Fetal testing to check the health, activity level, and growth of the fetus. Testing is done if you have certain medical conditions or if   there are problems during the pregnancy.  HIV (human immunodeficiency virus) testing. If you are at high risk, you may be screened for HIV during your third trimester of pregnancy. FALSE LABOR You may feel small, irregular contractions that eventually go away. These are called Braxton Hicks contractions, or false labor. Contractions may last for hours, days, or even weeks before true labor sets in. If contractions come at regular intervals, intensify, or become painful, it is best to be seen  by your caregiver.  SIGNS OF LABOR   Menstrual-like cramps.  Contractions that are 5 minutes apart or less.  Contractions that start on the top of the uterus and spread down to the lower abdomen and back.  A sense of increased pelvic pressure or back pain.  A watery or bloody mucus discharge that comes from the vagina. If you have any of these signs before the 37th week of pregnancy, call your caregiver right away. You need to go to the hospital to get checked immediately. HOME CARE INSTRUCTIONS   Avoid all smoking, herbs, alcohol, and unprescribed drugs. These chemicals affect the formation and growth of the baby.  Do not use any tobacco products, including cigarettes, chewing tobacco, and electronic cigarettes. If you need help quitting, ask your health care provider. You may receive counseling support and other resources to help you quit.  Follow your caregiver's instructions regarding medicine use. There are medicines that are either safe or unsafe to take during pregnancy.  Exercise only as directed by your caregiver. Experiencing uterine cramps is a good sign to stop exercising.  Continue to eat regular, healthy meals.  Wear a good support bra for breast tenderness.  Do not use hot tubs, steam rooms, or saunas.  Wear your seat belt at all times when driving.  Avoid raw meat, uncooked cheese, cat litter boxes, and soil used by cats. These carry germs that can cause birth defects in the baby.  Take your prenatal vitamins.  Take 1500-2000 mg of calcium daily starting at the 20th week of pregnancy until you deliver your baby.  Try taking a stool softener (if your caregiver approves) if you develop constipation. Eat more high-fiber foods, such as fresh vegetables or fruit and whole grains. Drink plenty of fluids to keep your urine clear or pale yellow.  Take warm sitz baths to soothe any pain or discomfort caused by hemorrhoids. Use hemorrhoid cream if your caregiver  approves.  If you develop varicose veins, wear support hose. Elevate your feet for 15 minutes, 3-4 times a day. Limit salt in your diet.  Avoid heavy lifting, wear low heal shoes, and practice good posture.  Rest a lot with your legs elevated if you have leg cramps or low back pain.  Visit your dentist if you have not gone during your pregnancy. Use a soft toothbrush to brush your teeth and be gentle when you floss.  A sexual relationship may be continued unless your caregiver directs you otherwise.  Do not travel far distances unless it is absolutely necessary and only with the approval of your caregiver.  Take prenatal classes to understand, practice, and ask questions about the labor and delivery.  Make a trial run to the hospital.  Pack your hospital bag.  Prepare the baby's nursery.  Continue to go to all your prenatal visits as directed by your caregiver. SEEK MEDICAL CARE IF:  You are unsure if you are in labor or if your water has broken.  You have dizziness.  You have   mild pelvic cramps, pelvic pressure, or nagging pain in your abdominal area.  You have persistent nausea, vomiting, or diarrhea.  You have a bad smelling vaginal discharge.  You have pain with urination. SEEK IMMEDIATE MEDICAL CARE IF:   You have a fever.  You are leaking fluid from your vagina.  You have spotting or bleeding from your vagina.  You have severe abdominal cramping or pain.  You have rapid weight loss or gain.  You have shortness of breath with chest pain.  You notice sudden or extreme swelling of your face, hands, ankles, feet, or legs.  You have not felt your baby move in over an hour.  You have severe headaches that do not go away with medicine.  You have vision changes.   This information is not intended to replace advice given to you by your health care provider. Make sure you discuss any questions you have with your health care provider.   Document Released:  05/06/2001 Document Revised: 06/02/2014 Document Reviewed: 07/13/2012 Elsevier Interactive Patient Education 2016 [employer]  Breastfeeding Deciding to breastfeed is one of the best choices you can make for you and your baby. A change in hormones during pregnancy causes your breast tissue to grow and increases the number and size of your milk ducts. These hormones also allow proteins, sugars, and fats from your blood supply to make breast milk in your milk-producing glands. Hormones prevent breast milk from being released before your baby is born as well as prompt milk flow after birth. Once breastfeeding has begun, thoughts of your baby, as well as his or her sucking or crying, can stimulate the release of milk from your milk-producing glands.  BENEFITS OF BREASTFEEDING For Your Baby  Your first milk (colostrum) helps your baby's digestive system function better.  There are antibodies in your milk that help your baby fight off infections.  Your baby has a lower incidence of asthma, allergies, and sudden infant death syndrome.  The nutrients in breast milk are better for your baby than infant formulas and are designed uniquely for your baby's needs.  Breast milk improves your baby's brain development.  Your baby is less likely to develop other conditions, such as childhood obesity, asthma, or type 2 diabetes mellitus. For You  Breastfeeding helps to create a very special bond between you and your baby.  Breastfeeding is convenient. Breast milk is always available at the correct temperature and costs nothing.  Breastfeeding helps to burn calories and helps you lose the weight gained during pregnancy.  Breastfeeding makes your uterus contract to its prepregnancy size faster and slows bleeding (lochia) after you give birth.   Breastfeeding helps to lower your risk of developing type 2 diabetes mellitus, osteoporosis, and breast or ovarian cancer later in life. SIGNS THAT YOUR BABY IS  HUNGRY Early Signs of Hunger  Increased alertness or activity.  Stretching.  Movement of the head from side to side.  Movement of the head and opening of the mouth when the corner of the mouth or cheek is stroked (rooting).  Increased sucking sounds, smacking lips, cooing, sighing, or squeaking.  Hand-to-mouth movements.  Increased sucking of fingers or hands. Late Signs of Hunger  Fussing.  Intermittent crying. Extreme Signs of Hunger Signs of extreme hunger will require calming and consoling before your baby will be able to breastfeed successfully. Do not wait for the following signs of extreme hunger to occur before you initiate breastfeeding:  Restlessness.  A loud, strong cry.  Screaming.   BREASTFEEDING BASICS Breastfeeding Initiation  Find a comfortable place to sit or lie down, with your neck and back well supported.  Place a pillow or rolled up blanket under your baby to bring him or her to the level of your breast (if you are seated). Nursing pillows are specially designed to help support your arms and your baby while you breastfeed.  Make sure that your baby's abdomen is facing your abdomen.  Gently massage your breast. With your fingertips, massage from your chest wall toward your nipple in a circular motion. This encourages milk flow. You may need to continue this action during the feeding if your milk flows slowly.  Support your breast with 4 fingers underneath and your thumb above your nipple. Make sure your fingers are well away from your nipple and your baby's mouth.  Stroke your baby's lips gently with your finger or nipple.  When your baby's mouth is open wide enough, quickly bring your baby to your breast, placing your entire nipple and as much of the colored area around your nipple (areola) as possible into your baby's mouth.  More areola should be visible above your baby's upper lip than below the lower lip.  Your baby's tongue should be between his  or her lower gum and your breast.  Ensure that your baby's mouth is correctly positioned around your nipple (latched). Your baby's lips should create a seal on your breast and be turned out (everted).  It is common for your baby to suck about 2-3 minutes in order to start the flow of breast milk. Latching Teaching your baby how to latch on to your breast properly is very important. An improper latch can cause nipple pain and decreased milk supply for you and poor weight gain in your baby. Also, if your baby is not latched onto your nipple properly, he or she may swallow some air during feeding. This can make your baby fussy. Burping your baby when you switch breasts during the feeding can help to get rid of the air. However, teaching your baby to latch on properly is still the best way to prevent fussiness from swallowing air while breastfeeding. Signs that your baby has successfully latched on to your nipple:  Silent tugging or silent sucking, without causing you pain.  Swallowing heard between every 3-4 sucks.  Muscle movement above and in front of his or her ears while sucking. Signs that your baby has not successfully latched on to nipple:  Sucking sounds or smacking sounds from your baby while breastfeeding.  Nipple pain. If you think your baby has not latched on correctly, slip your finger into the corner of your baby's mouth to break the suction and place it between your baby's gums. Attempt breastfeeding initiation again. Signs of Successful Breastfeeding Signs from your baby:  A gradual decrease in the number of sucks or complete cessation of sucking.  Falling asleep.  Relaxation of his or her body.  Retention of a small amount of milk in his or her mouth.  Letting go of your breast by himself or herself. Signs from you:  Breasts that have increased in firmness, weight, and size 1-3 hours after feeding.  Breasts that are softer immediately after  breastfeeding.  Increased milk volume, as well as a change in milk consistency and color by the fifth day of breastfeeding.  Nipples that are not sore, cracked, or bleeding. Signs That Your Baby is Getting Enough Milk  Wetting at least 3 diapers in a 24-hour period.   The urine should be clear and pale yellow by age 5 days.  At least 3 stools in a 24-hour period by age 5 days. The stool should be soft and yellow.  At least 3 stools in a 24-hour period by age 7 days. The stool should be seedy and yellow.  No loss of weight greater than 10% of birth weight during the first 3 days of age.  Average weight gain of 4-7 ounces (113-198 g) per week after age 4 days.  Consistent daily weight gain by age 5 days, without weight loss after the age of 2 weeks. After a feeding, your baby may spit up a small amount. This is common. BREASTFEEDING FREQUENCY AND DURATION Frequent feeding will help you make more milk and can prevent sore nipples and breast engorgement. Breastfeed when you feel the need to reduce the fullness of your breasts or when your baby shows signs of hunger. This is called "breastfeeding on demand." Avoid introducing a pacifier to your baby while you are working to establish breastfeeding (the first 4-6 weeks after your baby is born). After this time you may choose to use a pacifier. Research has shown that pacifier use during the first year of a baby's life decreases the risk of sudden infant death syndrome (SIDS). Allow your baby to feed on each breast as long as he or she wants. Breastfeed until your baby is finished feeding. When your baby unlatches or falls asleep while feeding from the first breast, offer the second breast. Because newborns are often sleepy in the first few weeks of life, you may need to awaken your baby to get him or her to feed. Breastfeeding times will vary from baby to baby. However, the following rules can serve as a guide to help you ensure that your baby is  properly fed:  Newborns (babies 4 weeks of age or younger) may breastfeed every 1-3 hours.  Newborns should not go longer than 3 hours during the day or 5 hours during the night without breastfeeding.  You should breastfeed your baby a minimum of 8 times in a 24-hour period until you begin to introduce solid foods to your baby at around 6 months of age. BREAST MILK PUMPING Pumping and storing breast milk allows you to ensure that your baby is exclusively fed your breast milk, even at times when you are unable to breastfeed. This is especially important if you are going back to work while you are still breastfeeding or when you are not able to be present during feedings. Your lactation consultant can give you guidelines on how long it is safe to store breast milk. A breast pump is a machine that allows you to pump milk from your breast into a sterile bottle. The pumped breast milk can then be stored in a refrigerator or freezer. Some breast pumps are operated by hand, while others use electricity. Ask your lactation consultant which type will work best for you. Breast pumps can be purchased, but some hospitals and breastfeeding support groups lease breast pumps on a monthly basis. A lactation consultant can teach you how to hand express breast milk, if you prefer not to use a pump. CARING FOR YOUR BREASTS WHILE YOU BREASTFEED Nipples can become dry, cracked, and sore while breastfeeding. The following recommendations can help keep your breasts moisturized and healthy:  Avoid using soap on your nipples.  Wear a supportive bra. Although not required, special nursing bras and tank tops are designed to allow access to your   breasts for breastfeeding without taking off your entire bra or top. Avoid wearing underwire-style bras or extremely tight bras.  Air dry your nipples for 3-4minutes after each feeding.  Use only cotton bra pads to absorb leaked breast milk. Leaking of breast milk between feedings  is normal.  Use lanolin on your nipples after breastfeeding. Lanolin helps to maintain your skin's normal moisture barrier. If you use pure lanolin, you do not need to wash it off before feeding your baby again. Pure lanolin is not toxic to your baby. You may also hand express a few drops of breast milk and gently massage that milk into your nipples and allow the milk to air dry. In the first few weeks after giving birth, some women experience extremely full breasts (engorgement). Engorgement can make your breasts feel heavy, warm, and tender to the touch. Engorgement peaks within 3-5 days after you give birth. The following recommendations can help ease engorgement:  Completely empty your breasts while breastfeeding or pumping. You may want to start by applying warm, moist heat (in the shower or with warm water-soaked hand towels) just before feeding or pumping. This increases circulation and helps the milk flow. If your baby does not completely empty your breasts while breastfeeding, pump any extra milk after he or she is finished.  Wear a snug bra (nursing or regular) or tank top for 1-2 days to signal your body to slightly decrease milk production.  Apply ice packs to your breasts, unless this is too uncomfortable for you.  Make sure that your baby is latched on and positioned properly while breastfeeding. If engorgement persists after 48 hours of following these recommendations, contact your health care provider or a lactation consultant. OVERALL HEALTH CARE RECOMMENDATIONS WHILE BREASTFEEDING  Eat healthy foods. Alternate between meals and snacks, eating 3 of each per day. Because what you eat affects your breast milk, some of the foods may make your baby more irritable than usual. Avoid eating these foods if you are sure that they are negatively affecting your baby.  Drink milk, fruit juice, and water to satisfy your thirst (about 10 glasses a day).  Rest often, relax, and continue to take  your prenatal vitamins to prevent fatigue, stress, and anemia.  Continue breast self-awareness checks.  Avoid chewing and smoking tobacco. Chemicals from cigarettes that pass into breast milk and exposure to secondhand smoke may harm your baby.  Avoid alcohol and drug use, including marijuana. Some medicines that may be harmful to your baby can pass through breast milk. It is important to ask your health care provider before taking any medicine, including all over-the-counter and prescription medicine as well as vitamin and herbal supplements. It is possible to become pregnant while breastfeeding. If birth control is desired, ask your health care provider about options that will be safe for your baby. SEEK MEDICAL CARE IF:  You feel like you want to stop breastfeeding or have become frustrated with breastfeeding.  You have painful breasts or nipples.  Your nipples are cracked or bleeding.  Your breasts are red, tender, or warm.  You have a swollen area on either breast.  You have a fever or chills.  You have nausea or vomiting.  You have drainage other than breast milk from your nipples.  Your breasts do not become full before feedings by the fifth day after you give birth.  You feel sad and depressed.  Your baby is too sleepy to eat well.  Your baby is having trouble sleeping.     Your baby is wetting less than 3 diapers in a 24-hour period.  Your baby has less than 3 stools in a 24-hour period.  Your baby's skin or the white part of his or her eyes becomes yellow.   Your baby is not gaining weight by 5 days of age. SEEK IMMEDIATE MEDICAL CARE IF:  Your baby is overly tired (lethargic) and does not want to wake up and feed.  Your baby develops an unexplained fever.   This information is not intended to replace advice given to you by your health care provider. Make sure you discuss any questions you have with your health care provider.   Document Released: 05/12/2005  Document Revised: 01/31/2015 Document Reviewed: 11/03/2012 Elsevier Interactive Patient Education 2016 [employer] Inc[employer]  

## 2016-03-27 NOTE — Progress Notes (Signed)
   PRENATAL VISIT NOTE  Subjective:  Kimberly Pace is a 40 y.o. [id number] at [redacted]w[redacted]d being seen today for ongoing prenatal care.  She is currently monitored for the following issues for this high-risk pregnancy and has Prior ectopic pregnancy, antepartum; Smoker; Obesity; Drug abuse and dependence ([place]); History of fetal anomaly in prior pregnancy, currently pregnant; Antepartum multigravida of advanced maternal age; Previous cesarean section complicating pregnancy, antepartum condition or complication; and Poor weight gain of pregnancy on her problem list.  Patient reports no complaints.  Contractions: Not present.  .  Movement: Present. Denies leaking of fluid.   The following portions of the patient's history were reviewed and updated as appropriate: allergies, current medications, past family history, past medical history, past social history, past surgical history and problem list. Problem list updated.  Objective:   Vitals:   03/27/16 1049  BP: 119/71  Pulse: 90  Weight: 216 lb (98 kg)    Fetal Status: Fetal Heart Rate (bpm): 125 Fundal Height: 40 cm Movement: Present     General:  Alert, oriented and cooperative. Patient is in no acute distress.  Skin: Skin is warm and dry. No rash noted.   Cardiovascular: Normal heart rate noted  Respiratory: Normal respiratory effort, no problems with respiration noted  Abdomen: Soft, gravid, appropriate for gestational age. Pain/Pressure: Present     Pelvic:  Cervical exam deferred        Extremities: Normal range of motion.  Edema: None  Mental Status: Normal mood and affect. Normal behavior. Normal judgment and thought content.   Assessment and Plan:  Pregnancy: [id number] at [redacted]w[redacted]d  1. Drug abuse and dependence [facility]) Patient on Suboxone  2. Previous cesarean section complicating pregnancy, antepartum condition or complication Scheduled at 39 wks with BTL Cultures today Limited care with transportation issues, no visit since 9/17 -  Urine cytology ancillary only - Culture, beta strep (group b only)  3. Prior ectopic pregnancy, antepartum In C-section scar--no surgery, no removal of tubes.  Preterm labor symptoms and general obstetric precautions including but not limited to vaginal bleeding, contractions, leaking of fluid and fetal movement were reviewed in detail with the patient. Please refer to After Visit Summary for other counseling recommendations.  Return in 1 week (on 04/03/2016).  Donnamae Jude, MD

## 2016-03-27 NOTE — Telephone Encounter (Signed)
Preadmission screen  

## 2016-03-28 ENCOUNTER — Encounter (HOSPITAL_COMMUNITY): Payer: Self-pay

## 2016-03-28 LAB — URINE CYTOLOGY ANCILLARY ONLY
CHLAMYDIA, DNA PROBE: NEGATIVE
NEISSERIA GONORRHEA: NEGATIVE

## 2016-03-29 LAB — CULTURE, BETA STREP (GROUP B ONLY)

## 2016-04-01 ENCOUNTER — Telehealth: Payer: Self-pay | Admitting: Family Medicine

## 2016-04-01 NOTE — Telephone Encounter (Signed)
Patient called to say she needed her appointment moved up to an earlier time because her children were getting out of school early. I informed patient we didn't have a sooner appointment, but would call if someone canceled. Patient stated she felt like she would be ok because she is getting induced on 11/12. She said if she has any problems, she would go to the [facility].

## 2016-04-02 ENCOUNTER — Encounter: Payer: Medicaid Other | Admitting: Obstetrics and Gynecology

## 2016-04-03 ENCOUNTER — Encounter (HOSPITAL_COMMUNITY)
Admission: RE | Admit: 2016-04-03 | Discharge: 2016-04-03 | Disposition: A | Discharge status: Home / Self Care | Payer: Medicaid Other | Source: Ambulatory Visit | Attending: Obstetrics & Gynecology | Admitting: Obstetrics & Gynecology

## 2016-04-03 LAB — CBC
HCT: 33 % — ABNORMAL LOW (ref 36.0–46.0)
HEMOGLOBIN: 11.1 g/dL — AB (ref 12.0–15.0)
MCH: 26.7 pg (ref 26.0–34.0)
MCHC: 33.6 g/dL (ref 30.0–36.0)
MCV: 79.5 fL (ref 78.0–100.0)
Platelets: 321 10*3/uL (ref 150–400)
RBC: 4.15 MIL/uL (ref 3.87–5.11)
RDW: 15.9 % — ABNORMAL HIGH (ref 11.5–15.5)
WBC: 13.4 10*3/uL — ABNORMAL HIGH (ref 4.0–10.5)

## 2016-04-03 LAB — TYPE AND SCREEN
ABO/RH(D): O POS
ANTIBODY SCREEN: NEGATIVE

## 2016-04-03 NOTE — Patient Instructions (Signed)
20 Kimberly Pace  04/03/2016   Your procedure is scheduled on:  04/06/2016  Enter through the Main Entrance of [facility] at [place] up the phone at the desk and dial [phone].   Call this number if you have problems the morning of surgery: [phone]   Remember:   Do not eat food:After Midnight.  Do not drink clear liquids: After Midnight.  Take these medicines the morning of surgery with A SIP OF WATER: suboxone   Do not wear jewelry, make-up or nail polish.  Do not wear lotions, powders, or perfumes. Do not wear deodorant.  Do not shave 48 hours prior to surgery.  Do not bring valuables to the hospital.  [facility] is not   responsible for any belongings or valuables brought to the hospital.  Contacts, dentures or bridgework may not be worn into surgery.  Leave suitcase in the car. After surgery it may be brought to your room.  For patients admitted to the hospital, checkout time is 11:00 AM the day of              discharge.   Patients discharged the day of surgery will not be allowed to drive             home.  Name and phone number of your driver: na   Special Instructions:   N/A   Please read over the following fact sheets that you were given:   Surgical Site Infection Prevention

## 2016-04-04 ENCOUNTER — Encounter (HOSPITAL_COMMUNITY)
Admission: RE | Admit: 2016-04-04 | Discharge: 2016-04-04 | Disposition: A | Discharge status: Home / Self Care | Payer: Medicaid Other | Source: Ambulatory Visit | Attending: Obstetrics & Gynecology | Admitting: Obstetrics & Gynecology

## 2016-04-04 LAB — RPR: RPR: NONREACTIVE

## 2016-04-06 ENCOUNTER — Encounter (HOSPITAL_COMMUNITY)
Admission: RE | Disposition: A | Discharge status: Home / Self Care | Payer: Self-pay | Source: Ambulatory Visit | Attending: Obstetrics & Gynecology

## 2016-04-06 ENCOUNTER — Inpatient Hospital Stay (HOSPITAL_COMMUNITY): Payer: Medicaid Other | Admitting: Anesthesiology

## 2016-04-06 ENCOUNTER — Encounter (HOSPITAL_COMMUNITY): Payer: Self-pay

## 2016-04-06 ENCOUNTER — Inpatient Hospital Stay (HOSPITAL_COMMUNITY)
Admission: RE | Admit: 2016-04-06 | Discharge: 2016-04-09 | DRG: 765 | Disposition: A | Discharge status: Home / Self Care | Payer: Medicaid Other | Source: Ambulatory Visit | Attending: Obstetrics & Gynecology | Admitting: Obstetrics & Gynecology

## 2016-04-06 DIAGNOSIS — Z302 Encounter for sterilization: Secondary | ICD-10-CM

## 2016-04-06 DIAGNOSIS — F1290 Cannabis use, unspecified, uncomplicated: Secondary | ICD-10-CM | POA: Diagnosis present

## 2016-04-06 DIAGNOSIS — O34211 Maternal care for low transverse scar from previous cesarean delivery: Principal | ICD-10-CM | POA: Diagnosis present

## 2016-04-06 DIAGNOSIS — Z6841 Body Mass Index (BMI) 40.0 and over, adult: Secondary | ICD-10-CM

## 2016-04-06 DIAGNOSIS — O99214 Obesity complicating childbirth: Secondary | ICD-10-CM | POA: Diagnosis present

## 2016-04-06 DIAGNOSIS — O09529 Supervision of elderly multigravida, unspecified trimester: Secondary | ICD-10-CM

## 2016-04-06 DIAGNOSIS — O34219 Maternal care for unspecified type scar from previous cesarean delivery: Secondary | ICD-10-CM | POA: Diagnosis present

## 2016-04-06 DIAGNOSIS — O99324 Drug use complicating childbirth: Secondary | ICD-10-CM | POA: Diagnosis present

## 2016-04-06 DIAGNOSIS — O2613 Low weight gain in pregnancy, third trimester: Secondary | ICD-10-CM

## 2016-04-06 DIAGNOSIS — Z8249 Family history of ischemic heart disease and other diseases of the circulatory system: Secondary | ICD-10-CM | POA: Diagnosis not present

## 2016-04-06 DIAGNOSIS — F17210 Nicotine dependence, cigarettes, uncomplicated: Secondary | ICD-10-CM | POA: Diagnosis present

## 2016-04-06 DIAGNOSIS — O99334 Smoking (tobacco) complicating childbirth: Secondary | ICD-10-CM | POA: Diagnosis present

## 2016-04-06 DIAGNOSIS — M797 Fibromyalgia: Secondary | ICD-10-CM | POA: Diagnosis present

## 2016-04-06 DIAGNOSIS — Z3A39 39 weeks gestation of pregnancy: Secondary | ICD-10-CM | POA: Diagnosis not present

## 2016-04-06 DIAGNOSIS — Z833 Family history of diabetes mellitus: Secondary | ICD-10-CM | POA: Diagnosis not present

## 2016-04-06 DIAGNOSIS — Z9889 Other specified postprocedural states: Secondary | ICD-10-CM

## 2016-04-06 LAB — CBC
HEMATOCRIT: 32.4 % — AB (ref 36.0–46.0)
Hemoglobin: 10.8 g/dL — ABNORMAL LOW (ref 12.0–15.0)
MCH: 26.6 pg (ref 26.0–34.0)
MCHC: 33.3 g/dL (ref 30.0–36.0)
MCV: 79.8 fL (ref 78.0–100.0)
PLATELETS: 346 10*3/uL (ref 150–400)
RBC: 4.06 MIL/uL (ref 3.87–5.11)
RDW: 16.1 % — AB (ref 11.5–15.5)
WBC: 15.2 10*3/uL — AB (ref 4.0–10.5)

## 2016-04-06 LAB — PREPARE RBC (CROSSMATCH)

## 2016-04-06 SURGERY — Surgical Case
Anesthesia: Epidural | Site: Abdomen | Laterality: Bilateral

## 2016-04-06 MED ORDER — KETOROLAC TROMETHAMINE 30 MG/ML IJ SOLN
INTRAMUSCULAR | Status: AC
  Administered 2016-04-06: 30 mg via INTRAMUSCULAR
  Filled 2016-04-06: qty 1

## 2016-04-06 MED ORDER — LACTATED RINGERS IV SOLN
INTRAVENOUS | Status: DC
  Administered 2016-04-06 – 2016-04-07 (×2): via INTRAVENOUS

## 2016-04-06 MED ORDER — PRENATAL MULTIVITAMIN CH
1.0000 | ORAL_TABLET | Freq: Every day | ORAL | Status: DC
  Administered 2016-04-07 – 2016-04-09 (×3): 1 via ORAL
  Filled 2016-04-06 (×3): qty 1

## 2016-04-06 MED ORDER — OXYTOCIN 10 UNIT/ML IJ SOLN
INTRAVENOUS | Status: DC | PRN
  Administered 2016-04-06: 09:00:00 via INTRAVENOUS
  Administered 2016-04-06: 40 [IU] via INTRAVENOUS

## 2016-04-06 MED ORDER — SENNOSIDES-DOCUSATE SODIUM 8.6-50 MG PO TABS
2.0000 | ORAL_TABLET | ORAL | Status: DC
  Administered 2016-04-06 – 2016-04-09 (×3): 2 via ORAL
  Filled 2016-04-06 (×5): qty 2

## 2016-04-06 MED ORDER — ONDANSETRON HCL 4 MG/2ML IJ SOLN: 4.0000 mg | Freq: Four times a day (QID) | INTRAMUSCULAR | Status: DC | PRN

## 2016-04-06 MED ORDER — ONDANSETRON HCL 4 MG/2ML IJ SOLN: INTRAMUSCULAR | Status: DC | PRN

## 2016-04-06 MED ORDER — DEXAMETHASONE SODIUM PHOSPHATE 10 MG/ML IJ SOLN
INTRAMUSCULAR | Status: AC
  Filled 2016-04-06: qty 1

## 2016-04-06 MED ORDER — HYDROMORPHONE 1 MG/ML IV SOLN: INTRAVENOUS | Status: DC

## 2016-04-06 MED ORDER — DIBUCAINE 1 % RE OINT: 1.0000 "application " | TOPICAL_OINTMENT | RECTAL | Status: DC | PRN

## 2016-04-06 MED ORDER — BUPRENORPHINE HCL 8 MG SL SUBL
8.0000 mg | SUBLINGUAL_TABLET | Freq: Every day | SUBLINGUAL | Status: DC
  Administered 2016-04-07 – 2016-04-09 (×3): 8 mg via SUBLINGUAL
  Filled 2016-04-06 (×4): qty 1

## 2016-04-06 MED ORDER — DIPHENHYDRAMINE HCL 50 MG/ML IJ SOLN: 12.5000 mg | Freq: Four times a day (QID) | INTRAMUSCULAR | Status: DC | PRN

## 2016-04-06 MED ORDER — PNEUMOCOCCAL VAC POLYVALENT 25 MCG/0.5ML IJ INJ
0.5000 mL | INJECTION | INTRAMUSCULAR | Status: AC
  Administered 2016-04-08: 0.5 mL via INTRAMUSCULAR
  Filled 2016-04-06: qty 0.5

## 2016-04-06 MED ORDER — LIDOCAINE-EPINEPHRINE 2 %-1:100000 IJ SOLN
INTRAMUSCULAR | Status: DC | PRN
  Administered 2016-04-06: 1 mL via INTRADERMAL
  Administered 2016-04-06: 2 mL via INTRADERMAL
  Administered 2016-04-06: 3 mL via INTRADERMAL
  Administered 2016-04-06: 2 mL via INTRADERMAL
  Administered 2016-04-06 (×2): 1 mL via INTRADERMAL

## 2016-04-06 MED ORDER — DIPHENHYDRAMINE HCL 12.5 MG/5ML PO ELIX
12.5000 mg | ORAL_SOLUTION | Freq: Four times a day (QID) | ORAL | Status: DC | PRN
  Filled 2016-04-06: qty 5

## 2016-04-06 MED ORDER — ONDANSETRON HCL 4 MG/2ML IJ SOLN
INTRAMUSCULAR | Status: DC | PRN
  Administered 2016-04-06: 4 mg via INTRAVENOUS

## 2016-04-06 MED ORDER — HYDROMORPHONE 1 MG/ML IV SOLN
INTRAVENOUS | Status: DC
  Administered 2016-04-06: 3.3 mg via INTRAVENOUS
  Administered 2016-04-06: 7.12 mg via INTRAVENOUS
  Administered 2016-04-06: 0.5 mg via INTRAVENOUS
  Administered 2016-04-07: 4.2 mg via INTRAVENOUS
  Administered 2016-04-07: 11 mg via INTRAVENOUS
  Administered 2016-04-07: 3.3 mg via INTRAVENOUS

## 2016-04-06 MED ORDER — BUPRENORPHINE HCL-NALOXONE HCL 8-2 MG SL FILM: 1.0000 | ORAL_FILM | Freq: Every day | SUBLINGUAL | Status: DC

## 2016-04-06 MED ORDER — SIMETHICONE 80 MG PO CHEW
80.0000 mg | CHEWABLE_TABLET | ORAL | Status: DC | PRN
  Administered 2016-04-07: 80 mg via ORAL
  Filled 2016-04-06: qty 1

## 2016-04-06 MED ORDER — IBUPROFEN 600 MG PO TABS
600.0000 mg | ORAL_TABLET | Freq: Four times a day (QID) | ORAL | Status: DC
  Administered 2016-04-06 – 2016-04-09 (×11): 600 mg via ORAL
  Filled 2016-04-06 (×11): qty 1

## 2016-04-06 MED ORDER — SODIUM CHLORIDE 0.9% FLUSH: 9.0000 mL | INTRAVENOUS | Status: DC | PRN

## 2016-04-06 MED ORDER — HYDROMORPHONE HCL 1 MG/ML IJ SOLN: 0.2500 mg | INTRAMUSCULAR | Status: DC | PRN

## 2016-04-06 MED ORDER — ACETAMINOPHEN 325 MG PO TABS
650.0000 mg | ORAL_TABLET | ORAL | Status: DC | PRN
  Administered 2016-04-07 – 2016-04-09 (×12): 650 mg via ORAL
  Filled 2016-04-06 (×12): qty 2

## 2016-04-06 MED ORDER — ZOLPIDEM TARTRATE 5 MG PO TABS: 5.0000 mg | ORAL_TABLET | Freq: Every evening | ORAL | Status: DC | PRN

## 2016-04-06 MED ORDER — OXYTOCIN 40 UNITS IN LACTATED RINGERS INFUSION - SIMPLE MED: 2.5000 [IU]/h | INTRAVENOUS | Status: AC

## 2016-04-06 MED ORDER — SIMETHICONE 80 MG PO CHEW
80.0000 mg | CHEWABLE_TABLET | Freq: Three times a day (TID) | ORAL | Status: DC
  Administered 2016-04-06 – 2016-04-09 (×9): 80 mg via ORAL
  Filled 2016-04-06 (×9): qty 1

## 2016-04-06 MED ORDER — MENTHOL 3 MG MT LOZG: 1.0000 | LOZENGE | OROMUCOSAL | Status: DC | PRN

## 2016-04-06 MED ORDER — NALOXONE HCL 0.4 MG/ML IJ SOLN: 0.4000 mg | INTRAMUSCULAR | Status: DC | PRN

## 2016-04-06 MED ORDER — LACTATED RINGERS IV SOLN
INTRAVENOUS | Status: DC | PRN
  Administered 2016-04-06: 10:00:00 via INTRAVENOUS

## 2016-04-06 MED ORDER — PHENYLEPHRINE 8 MG IN D5W 100 ML (0.08MG/ML) PREMIX OPTIME
INJECTION | INTRAVENOUS | Status: DC | PRN
  Administered 2016-04-06: 60 ug/min via INTRAVENOUS

## 2016-04-06 MED ORDER — TETANUS-DIPHTH-ACELL PERTUSSIS 5-2.5-18.5 LF-MCG/0.5 IM SUSP: 0.5000 mL | Freq: Once | INTRAMUSCULAR | Status: DC

## 2016-04-06 MED ORDER — BUPIVACAINE HCL (PF) 0.5 % IJ SOLN
INTRAMUSCULAR | Status: AC
  Filled 2016-04-06: qty 30

## 2016-04-06 MED ORDER — PHENYLEPHRINE 8 MG IN D5W 100 ML (0.08MG/ML) PREMIX OPTIME
INJECTION | INTRAVENOUS | Status: AC
  Filled 2016-04-06: qty 100

## 2016-04-06 MED ORDER — DIPHENHYDRAMINE HCL 12.5 MG/5ML PO ELIX: 12.5000 mg | ORAL_SOLUTION | Freq: Four times a day (QID) | ORAL | Status: DC | PRN

## 2016-04-06 MED ORDER — DEXAMETHASONE SODIUM PHOSPHATE 10 MG/ML IJ SOLN
INTRAMUSCULAR | Status: DC | PRN
  Administered 2016-04-06: 4 mg via INTRAVENOUS

## 2016-04-06 MED ORDER — HYDROMORPHONE 1 MG/ML IV SOLN
INTRAVENOUS | Status: DC
  Administered 2016-04-06: 12:00:00 via INTRAVENOUS
  Filled 2016-04-06: qty 25

## 2016-04-06 MED ORDER — WITCH HAZEL-GLYCERIN EX PADS: 1.0000 "application " | MEDICATED_PAD | CUTANEOUS | Status: DC | PRN

## 2016-04-06 MED ORDER — ONDANSETRON HCL 4 MG/2ML IJ SOLN
INTRAMUSCULAR | Status: AC
  Filled 2016-04-06: qty 2

## 2016-04-06 MED ORDER — CEFAZOLIN SODIUM-DEXTROSE 2-4 GM/100ML-% IV SOLN
2.0000 g | INTRAVENOUS | Status: AC
  Administered 2016-04-06: 2 g via INTRAVENOUS
  Filled 2016-04-06: qty 100

## 2016-04-06 MED ORDER — SODIUM CHLORIDE 0.9 % IR SOLN
Status: DC | PRN
  Administered 2016-04-06: 1000 mL

## 2016-04-06 MED ORDER — KETOROLAC TROMETHAMINE 30 MG/ML IJ SOLN
30.0000 mg | Freq: Once | INTRAMUSCULAR | Status: AC
  Administered 2016-04-06: 30 mg via INTRAMUSCULAR

## 2016-04-06 MED ORDER — LACTATED RINGERS IV SOLN
INTRAVENOUS | Status: DC
  Administered 2016-04-06 (×4): via INTRAVENOUS

## 2016-04-06 MED ORDER — OXYTOCIN 10 UNIT/ML IJ SOLN
INTRAMUSCULAR | Status: AC
  Filled 2016-04-06: qty 4

## 2016-04-06 MED ORDER — SCOPOLAMINE 1 MG/3DAYS TD PT72
MEDICATED_PATCH | TRANSDERMAL | Status: DC | PRN
  Administered 2016-04-06: 1 via TRANSDERMAL

## 2016-04-06 MED ORDER — FENTANYL CITRATE (PF) 100 MCG/2ML IJ SOLN
INTRAMUSCULAR | Status: AC
  Filled 2016-04-06: qty 2

## 2016-04-06 MED ORDER — FENTANYL CITRATE (PF) 100 MCG/2ML IJ SOLN
INTRAMUSCULAR | Status: DC | PRN
  Administered 2016-04-06: 50 ug via INTRAVENOUS

## 2016-04-06 MED ORDER — SOD CITRATE-CITRIC ACID 500-334 MG/5ML PO SOLN
ORAL | Status: AC
  Administered 2016-04-06: 30 mL
  Filled 2016-04-06: qty 15

## 2016-04-06 MED ORDER — SCOPOLAMINE 1 MG/3DAYS TD PT72
MEDICATED_PATCH | TRANSDERMAL | Status: AC
  Filled 2016-04-06: qty 1

## 2016-04-06 MED ORDER — DIPHENHYDRAMINE HCL 25 MG PO CAPS
25.0000 mg | ORAL_CAPSULE | Freq: Four times a day (QID) | ORAL | Status: DC | PRN
  Filled 2016-04-06: qty 1

## 2016-04-06 MED ORDER — SODIUM CHLORIDE 0.9% FLUSH
INTRAVENOUS | Status: AC
  Filled 2016-04-06: qty 3

## 2016-04-06 MED ORDER — MEASLES, MUMPS & RUBELLA VAC ~~LOC~~ INJ
0.5000 mL | INJECTION | Freq: Once | SUBCUTANEOUS | Status: DC
  Filled 2016-04-06: qty 0.5

## 2016-04-06 MED ORDER — BUPIVACAINE IN DEXTROSE 0.75-8.25 % IT SOLN
INTRATHECAL | Status: AC
  Filled 2016-04-06: qty 2

## 2016-04-06 MED ORDER — COCONUT OIL OIL: 1.0000 "application " | TOPICAL_OIL | Status: DC | PRN

## 2016-04-06 MED ORDER — BUPIVACAINE HCL (PF) 0.5 % IJ SOLN
INTRAMUSCULAR | Status: DC | PRN
  Administered 2016-04-06: 30 mL

## 2016-04-06 MED ORDER — SIMETHICONE 80 MG PO CHEW
80.0000 mg | CHEWABLE_TABLET | ORAL | Status: DC
  Administered 2016-04-06 – 2016-04-09 (×3): 80 mg via ORAL
  Filled 2016-04-06 (×3): qty 1

## 2016-04-06 SURGICAL SUPPLY — 47 items
BENZOIN TINCTURE PRP APPL 2/3 (GAUZE/BANDAGES/DRESSINGS) ×3 IMPLANT
BINDER ABD UNIV 10 28-50 (GAUZE/BANDAGES/DRESSINGS) IMPLANT
BINDER ABD UNIV 12 45-62 (WOUND CARE) IMPLANT
BINDER ABDOM UNIV 10 (GAUZE/BANDAGES/DRESSINGS)
BINDER ABDOMINAL 46IN 62IN (WOUND CARE)
CLAMP CORD UMBIL (MISCELLANEOUS) IMPLANT
CLIP FILSHIE TUBAL LIGA STRL (Clip) ×6 IMPLANT
CLOSURE WOUND 1/2 X4 (GAUZE/BANDAGES/DRESSINGS) ×1
CLOTH BEACON ORANGE TIMEOUT ST (SAFETY) ×3 IMPLANT
DECANTER SPIKE VIAL GLASS SM (MISCELLANEOUS) ×3 IMPLANT
DRSG OPSITE POSTOP 4X10 (GAUZE/BANDAGES/DRESSINGS) ×6 IMPLANT
DURAPREP 26ML APPLICATOR (WOUND CARE) ×3 IMPLANT
ELECT REM PT RETURN 9FT ADLT (ELECTROSURGICAL) ×3
ELECTRODE REM PT RTRN 9FT ADLT (ELECTROSURGICAL) ×1 IMPLANT
EXTRACTOR VACUUM KIWI (MISCELLANEOUS) IMPLANT
GLOVE BIO SURGEON STRL SZ7 (GLOVE) ×3 IMPLANT
GLOVE BIOGEL PI IND STRL 7.0 (GLOVE) ×2 IMPLANT
GLOVE BIOGEL PI INDICATOR 7.0 (GLOVE) ×4
GOWN STRL REUS W/TWL LRG LVL3 (GOWN DISPOSABLE) ×6 IMPLANT
GOWN STRL REUS W/TWL XL LVL3 (GOWN DISPOSABLE) ×3 IMPLANT
KIT ABG SYR 3ML LUER SLIP (SYRINGE) IMPLANT
NEEDLE HYPO 22GX1.5 SAFETY (NEEDLE) ×3 IMPLANT
NEEDLE HYPO 25X5/8 SAFETYGLIDE (NEEDLE) IMPLANT
NS IRRIG 1000ML POUR BTL (IV SOLUTION) ×3 IMPLANT
PACK C SECTION WH (CUSTOM PROCEDURE TRAY) ×3 IMPLANT
PAD ABD 7.5X8 STRL (GAUZE/BANDAGES/DRESSINGS) ×3 IMPLANT
PAD ABD DERMACEA PRESS 5X9 (GAUZE/BANDAGES/DRESSINGS) ×3 IMPLANT
PAD OB MATERNITY 4.3X12.25 (PERSONAL CARE ITEMS) ×3 IMPLANT
PENCIL SMOKE EVAC W/HOLSTER (ELECTROSURGICAL) ×3 IMPLANT
RETRACTOR TRAXI PANNICULUS (MISCELLANEOUS) ×1 IMPLANT
RETRACTOR WND ALEXIS 25 LRG (MISCELLANEOUS) ×1 IMPLANT
RTRCTR C-SECT PINK 25CM LRG (MISCELLANEOUS) IMPLANT
RTRCTR WOUND ALEXIS 25CM LRG (MISCELLANEOUS) ×3
SPONGE SURGIFOAM ABS GEL 12-7 (HEMOSTASIS) IMPLANT
STRIP CLOSURE SKIN 1/2X4 (GAUZE/BANDAGES/DRESSINGS) ×2 IMPLANT
SUT PDS AB 0 CTX 60 (SUTURE) ×3 IMPLANT
SUT PLAIN 0 NONE (SUTURE) IMPLANT
SUT SILK 0 TIES 10X30 (SUTURE) IMPLANT
SUT VIC AB 0 CT1 36 (SUTURE) ×9 IMPLANT
SUT VIC AB 3-0 CT1 27 (SUTURE) ×2
SUT VIC AB 3-0 CT1 TAPERPNT 27 (SUTURE) ×1 IMPLANT
SUT VIC AB 4-0 KS 27 (SUTURE) ×3 IMPLANT
SWABSTK COMLB BENZOIN TINCTURE (MISCELLANEOUS) ×3 IMPLANT
SYR CONTROL 10ML LL (SYRINGE) ×3 IMPLANT
TOWEL OR 17X24 6PK STRL BLUE (TOWEL DISPOSABLE) ×3 IMPLANT
TRAXI PANNICULUS RETRACTOR (MISCELLANEOUS) ×2
TRAY FOLEY CATH SILVER 14FR (SET/KITS/TRAYS/PACK) ×3 IMPLANT

## 2016-04-06 NOTE — H&P (Signed)
Obstetric Preoperative History and Physical  Kimberly Pace is a 40 y.o. [id number] with IUP at [redacted]w[redacted]d presenting for presenting for scheduled repeat cesarean section.  No acute concerns.   Prenatal Course Source of Care: [facility] with onset of care at 9 weeks Pregnancy complications or risks: Patient Active Problem List   Diagnosis Date Noted  . Poor weight gain of pregnancy 02/05/2016  . Previous cesarean section complicating pregnancy, antepartum condition or complication [id number]  . Antepartum multigravida of advanced maternal age 38/19/2017  . Drug abuse and dependence ([place]) 03/29/2014  . History of fetal anomaly in prior pregnancy, currently pregnant 03/29/2014  . Smoker 03/08/2014  . Obesity 03/08/2014  . Prior ectopic pregnancy, antepartum 06/16/2013   She plans to breastfeed She desires BTL for postpartum contraception.   Prenatal labs and studies: ABO, Rh: --/--/O POS (11/12 0730) Antibody: NEG (11/12 0730) Rubella: 2.27 (05/19 1048) RPR: Non Reactive (11/09 1055)  HBsAg: NEGATIVE (05/19 1048)  HIV: NONREACTIVE (08/22 0908)  [id number] (11/02 0000) 1 hr Glucola  115 Genetic screening normal Anatomy US normal  Prenatal Transfer Tool  Maternal Diabetes: No Genetic Screening: Normal Maternal Ultrasounds/Referrals: Normal Fetal Ultrasounds or other Referrals:  None Maternal Substance Abuse:  Yes:  Type: Other: THC Significant Maternal Medications:  None Significant Maternal Lab Results: None  Past Medical History:  Diagnosis Date  . Anemia    history with pregnancy  . Anxiety    no meds since pregnancy  . Back pain   . Degenerative lumbar disc   . Fibromyalgia   . Headache(784.0)    otc med prn  . Hypertension   . Joint pain     Past Surgical History:  Procedure Laterality Date  . BACK SURGERY    . CESAREAN SECTION     x 3  . DILATION AND CURETTAGE OF UTERUS    . DILATION AND EVACUATION N/A 05/06/2014   Procedure: DILATATION AND EVACUATION;   Surgeon: Donnamae Jude, MD;  Location: [facility];  Service: Gynecology;  Laterality: N/A;  . MULTIPLE TOOTH EXTRACTIONS     upper dneures and lower partial  . WISDOM TOOTH EXTRACTION      OB History  Gravida Para Term Preterm AB Living  6 3 3  0 2 2  SAB TAB Ectopic Multiple Live Births  1 0 1 0 3    # Outcome Date GA Lbr Len/2nd Weight Sex Delivery Anes PTL Lv  6 Current           5 Ectopic 2016             Birth Comments: System Generated. Please review and update pregnancy details.  4 SAB 2016          3 Term 2007     CS-LTranv   LIV  2 Term 2004     CS-LTranv   LIV  1 Term 2001     CS-LTranv   ND     Birth Comments: breech    Obstetric Comments  Last pregnancy [facility] heartbeat at [facility]    Social History   Social History  . Marital status: Single    Spouse name: N/A  . Number of children: N/A  . Years of education: N/A   Social History Main Topics  . Smoking status: Light Tobacco Smoker    Packs/day: 0.25    Years: 15.00    Types: Cigarettes  . Smokeless tobacco: Never Used  . Alcohol use No     Comment: socially but none  with pregnancy.    . Drug use: No     Comment: suboxone  . Sexual activity: Yes    Birth control/ protection: None     Comment: 10 days ago   Other Topics Concern  . None   Social History Narrative  . None    Family History  Problem Relation Age of Onset  . Polycystic kidney disease Son     infantile, 2001  . Asthma Mother   . Hypertension Mother   . Diabetes Mother   . Anesthesia problems Neg Hx     Prescriptions Prior to Admission  Medication Sig Dispense Refill Last Dose  . Buprenorphine HCl-Naloxone HCl (SUBOXONE) 8-2 MG FILM Place 1 Film under the tongue daily.   unknown at unknown  . docusate sodium (COLACE) 100 MG capsule Take 100 mg by mouth 2 (two) times daily.   unknown at unknown  . polyethylene glycol (MIRALAX / GLYCOLAX) packet Take 17 g by mouth daily as needed for mild constipation.   unknown at unknown  . Prenatal  Vit-Fe Fumarate-FA (GOODSENSE PRENATAL VITAMINS) 28-0.8 MG TABS Take 1 tablet by mouth at bedtime. 30 tablet 0 unknown at unknown    No Known Allergies  Review of Systems: Negative except for what is mentioned in HPI.  Physical Exam: Temp 98.1 F (36.7 C) (Oral)   Resp 20   Ht 5' (1.524 m)   Wt 214 lb (97.1 kg)   LMP 07/08/2015 (Exact Date)   SpO2 100%   BMI 41.79 kg/m  FHR by Doppler: 140's bpm GENERAL: Well-developed, well-nourished female in no acute distress.  LUNGS: Clear to auscultation bilaterally.  HEART: Regular rate and rhythm. ABDOMEN: Soft, nontender, nondistended, gravid, transverse well-healed Pfannenstiel incision. PELVIC: Deferred EXTREMITIES: Nontender, no edema, 2+ distal pulses.   Pertinent Labs/Studies:   Results for orders placed or performed during the hospital encounter of 04/06/16 (from the past 72 hour(s))  CBC     Status: Abnormal   Collection Time: 04/06/16  7:30 AM  Result Value Ref Range   WBC 15.2 (H) 4.0 - 10.5 K/uL   RBC 4.06 3.87 - 5.11 MIL/uL   Hemoglobin 10.8 (L) 12.0 - 15.0 g/dL   HCT 32.4 (L) 36.0 - 46.0 %   MCV 79.8 78.0 - 100.0 fL   MCH 26.6 26.0 - 34.0 pg   MCHC 33.3 30.0 - 36.0 g/dL   RDW 16.1 (H) 11.5 - 15.5 %   Platelets 346 150 - 400 K/uL  Type and screen [place]     Status: None (Preliminary result)   Collection Time: 04/06/16  7:30 AM  Result Value Ref Range   ABO/RH(D) O POS    Antibody Screen NEG    Sample Expiration 04/09/2016    Unit Number [id number]    Blood Component Type RBC LR PHER1    Unit division 00    Status of Unit ALLOCATED    Transfusion Status OK TO TRANSFUSE    Crossmatch Result Compatible    Unit Number [id number]    Blood Component Type RED CELLS,LR    Unit division 00    Status of Unit ALLOCATED    Transfusion Status OK TO TRANSFUSE    Crossmatch Result Compatible   Prepare RBC (crossmatch)     Status: None   Collection Time: 04/06/16  9:00 AM  Result Value Ref  Range   Order Confirmation ORDER PROCESSED BY BLOOD BANK     Assessment and Plan :Kimberly Pace is a  40 y.o. [id number] at [redacted]w[redacted]d being admitted being admitted for scheduled cesarean section with BTL. The risks of cesarean section discussed with the patient included but were not limited to: bleeding which may require transfusion or reoperation; infection which may require antibiotics; injury to bowel, bladder, ureters or other surrounding organs; injury to the fetus; need for additional procedures including hysterectomy in the event of a life-threatening hemorrhage; placental abnormalities wth subsequent pregnancies, incisional problems, thromboembolic phenomenon and other postoperative/anesthesia complications. The patient concurred with the proposed plan, giving informed written consent for the procedure. Patient has been NPO since last night she will remain NPO for procedure. Anesthesia and OR aware. Preoperative prophylactic antibiotics and SCDs ordered on call to the OR. To OR when ready.

## 2016-04-06 NOTE — Brief Op Note (Signed)
04/06/2016  10:43 AM  PATIENT:  Kimberly Pace  40 y.o. female  PRE-OPERATIVE DIAGNOSIS:   REPEAT c-section  and undesired fertility  POST-OPERATIVE DIAGNOSIS:  REPEAT c-section  and undesired fertility  PROCEDURE:  Procedure(s): REPEAT CESAREAN SECTION WITH BILATERAL TUBAL LIGATION (Bilateral)  SURGEON:  Surgeon(s) and Role:    * Lavonia Drafts, MD - Primary  ANESTHESIA:   spinal  EBL:  Total I/O In: 3000 [I.V.:3000] Out: 1150 [Urine:350; Blood:800]  BLOOD ADMINISTERED:none  DRAINS: none   LOCAL MEDICATIONS USED:  MARCAINE     SPECIMEN:  Source of Specimen:  placenta  DISPOSITION OF SPECIMEN:  PATHOLOGY  COUNTS:  YES  TOURNIQUET:  * No tourniquets in log *  DICTATION: .Note written in EPIC  PLAN OF CARE: Admit to inpatient   PATIENT DISPOSITION:  PACU - hemodynamically stable.   Delay start of Pharmacological VTE agent (>24hrs) due to surgical blood loss or risk of bleeding: not applicable  Complications: none immediate  Lorenzo Pereyra L. Harraway-Smith, M.D., Cherlynn June

## 2016-04-06 NOTE — Anesthesia Procedure Notes (Signed)
Spinal  Patient location during procedure: OR Preanesthetic Checklist Completed: patient identified, site marked, surgical consent, pre-op evaluation, timeout performed, IV checked, risks and benefits discussed and monitors and equipment checked Spinal Block Patient position: sitting Prep: DuraPrep Patient monitoring: cardiac monitor, continuous pulse ox, blood pressure and heart rate Approach: midline Location: L3-4 Injection technique: catheter Needle Needle type: Tuohy and Sprotte  Needle gauge: 24 G Needle length: 12.7 cm Needle insertion depth: 7 cm Catheter type: closed end flexible Catheter size: 19 g Catheter at skin depth: 14 cm Additional Notes Spinal Dosage in OR  Bupivicaine ml       1.2 Catheter passed easily; (-) asp heme/CSF

## 2016-04-06 NOTE — Op Note (Signed)
04/06/2016  10:43 AM  PATIENT:  Kimberly Pace  40 y.o. female  PRE-OPERATIVE DIAGNOSIS:   REPEAT c-section  and undesired fertility  POST-OPERATIVE DIAGNOSIS:  REPEAT c-section  and undesired fertility  PROCEDURE:  Procedure(s): REPEAT CESAREAN SECTION WITH BILATERAL TUBAL LIGATION (Bilateral)  SURGEON:  Surgeon(s) and Role:    * Lavonia Drafts, MD - Primary  ANESTHESIA:   spinal  EBL:  Total I/O In: 3000 [I.V.:3000] Out: 1150 [Urine:350; Blood:800]  BLOOD ADMINISTERED:none  DRAINS: none   LOCAL MEDICATIONS USED:  MARCAINE     SPECIMEN:  Source of Specimen:  placenta  DISPOSITION OF SPECIMEN:  PATHOLOGY  COUNTS:  YES  TOURNIQUET:  * No tourniquets in log *  DICTATION: .Note written in EPIC  PLAN OF CARE: Admit to inpatient   PATIENT DISPOSITION:  PACU - hemodynamically stable.   Delay start of Pharmacological VTE agent (>24hrs) due to surgical blood loss or risk of bleeding: not applicable  Complications: none immediate  INDICATIONS: Pt is a 40 y.o. [id number] at [redacted] weeks gestation here for cesarean section with sterilization secondary to the indications listed under preoperative diagnosis; please see preoperative note for further details.  The risks of cesarean section were discussed with the patient including but were not limited to: bleeding which may require transfusion or reoperation; infection which may require antibiotics; injury to bowel, bladder, ureters or other surrounding organs; injury to the fetus; need for additional procedures including hysterectomy in the event of a life-threatening hemorrhage;incisional problems, thromboembolic phenomenon and other postoperative/anesthesia complications. Reviewed with patient in detail the risks of regret with a tubal sterilization.  Confirmed with the patient that a tubal ligation is meant to be permanent and reviewed the risk of failure as 3-08/998.  Patient confirmed that she does want a permanent  sterilization with her primary c-section. The patient concurred with the proposed plan, giving informed written consent for the procedure.    FINDINGS:  Viable female infant in cephalic presentation.  Apgars pending.  Clear amniotic fluid.  Intact placenta, three vessel cord.  Normal uterus, fallopian tubes and ovaries bilaterally.  PROCEDURE IN DETAIL:  The patient preoperatively received intravenous antibiotics and had sequential compression devices applied to her lower extremities.  She was then taken to the operating room where spinal anesthesia was administered and was found to be adequate. She was then placed in a dorsal supine position with a leftward tilt, and prepped and draped in a sterile manner.  A foley catheter was placed into her bladder and attached to constant gravity.  After an adequate timeout was performed, a transver skin incision was made with scalpel and carried through to the underlying layer of fascia. The fascia was incised in the midline, and this incision was extended bilaterally using the Mayo scissors.  Kocher clamps were applied to the superior aspect of the fascial incision and the underlying rectus muscles were dissected off bluntly. A similar process was carried out on the inferior aspect of the fascial incision. The rectus muscles were separated in the midline bluntly and the peritoneum was entered bluntly. Attention was turned to the lower uterine segment where a low transverse hysterotomy incision was made with a scalpel and extended bilaterally bluntly.  The infant was successfully delivered, the cord was clamped and cut and the infant was handed over to awaiting neonatology team. Uterine massage was then administered, and the placenta delivered intact with a three-vessel cord. The uterus was then cleared of clot and debris.  The hysterotomy was closed  with 0 Vicryl in a running locked fashion in 1 layer. The Fallopian tubes were identified bilaterally.  A Filshie clip was  placed on each tube without difficulty 3 cm from the cornua.  There was no bleeding. The pelvis was cleared of all clot and debris. Hemostasis was confirmed on all surfaces.  The peritoneum and the muscles were reapproximated using 0 Vicryl in 1 interrupted suture. The fascia was then closed using 0 Vicryl in a running fashion.  The subcutaneous layer was irrigated, then reapproximated with 3-0 vicryl in a running fashion, and the skin was closed with a 4-0 Vicryl subcuticular stitch. The incision was injected with 30cc of  0.5% Marcaine. The patient tolerated the procedure well. Sponge, lap, instrument and needle counts were correct x 2.  She was taken to the recovery room awake and in stable condition.   Kimberly Pace, M.D., Kimberly Pace

## 2016-04-06 NOTE — Transfer of Care (Signed)
Immediate Anesthesia Transfer of Care Note  Patient: Kimberly Pace  Procedure(s) Performed: Procedure(s): REPEAT CESAREAN SECTION WITH BILATERAL TUBAL LIGATION (Bilateral)  Patient Location: PACU  Anesthesia Type:Spinal  Level of Consciousness: awake  Airway & Oxygen Therapy: Patient Spontanous Breathing  Post-op Assessment: Report given to RN  Post vital signs: Reviewed and stable  Last Vitals:  Vitals:   04/06/16 1145 04/06/16 1148  BP: 102/62   Pulse: 70 65  Resp: 16 15  Temp:      Last Pain:  Vitals:   04/06/16 1130  TempSrc:   PainSc: 1          Complications: No apparent anesthesia complications

## 2016-04-06 NOTE — Anesthesia Postprocedure Evaluation (Signed)
Anesthesia Post Note  Patient: Kimberly Pace  Procedure(s) Performed: Procedure(s) (LRB): REPEAT CESAREAN SECTION WITH BILATERAL TUBAL LIGATION (Bilateral)  Patient location during evaluation: PACU Anesthesia Type: Spinal Level of consciousness: awake Pain management: satisfactory to patient Vital Signs Assessment: post-procedure vital signs reviewed and stable Respiratory status: spontaneous breathing Cardiovascular status: blood pressure returned to baseline Postop Assessment: no headache and spinal receding Anesthetic complications: no     Last Vitals:  Vitals:   04/06/16 1615 04/06/16 1816  BP: 125/83   Pulse:    Resp: 20 (!) 22  Temp: 36.5 C     Last Pain:  Vitals:   04/06/16 1615  TempSrc: Oral  PainSc: 8    Pain Goal: Patients Stated Pain Goal: 5 (04/06/16 1615)               Lyndle Herrlich EDWARD

## 2016-04-06 NOTE — Consult Note (Signed)
Neonatology Note:   Attendance at C-section:    I was asked by Dr. Ihor Dow to attend this repeat C/S at term. The mother is a G6P3 O pos, GBS neg with history of drug use, on Subutex and marijuana. She has fibromyalgia and back pain related to a degenerative lumbar disc. She smoked 1/4 ppd cigarettes during the pregnancy. ROM at delivery, fluid clear. Infant vigorous with good spontaneous cry and tone. Delayed cord clamping was done. Needed only minimal bulb suctioning. Ap 9/9. Lungs clear to ausc in DR. To CN to care of Pediatrician.   Real Cons, MD

## 2016-04-06 NOTE — Anesthesia Preprocedure Evaluation (Addendum)
Anesthesia Evaluation  Patient identified by MRN, date of birth, ID band Patient awake    Reviewed: Allergy & Precautions, H&P , Patient's Chart, lab work & pertinent test results  Airway Mallampati: II  TM Distance: >3 FB Neck ROM: full    Dental no notable dental hx.    Pulmonary Current Smoker,    Pulmonary exam normal breath sounds clear to auscultation       Cardiovascular Exercise Tolerance: Good hypertension,  Rhythm:regular Rate:Normal     Neuro/Psych    GI/Hepatic   Endo/Other  Morbid obesity  Renal/GU      Musculoskeletal   Abdominal   Peds  Hematology   Anesthesia Other Findings   Reproductive/Obstetrics                            Anesthesia Physical Anesthesia Plan  ASA: III  Anesthesia Plan: Spinal, Combined Spinal and Epidural and Epidural   Post-op Pain Management:    Induction:   Airway Management Planned:   Additional Equipment:   Intra-op Plan:   Post-operative Plan:   Informed Consent: I have reviewed the patients History and Physical, chart, labs and discussed the procedure including the risks, benefits and alternatives for the proposed anesthesia with the patient or authorized representative who has indicated his/her understanding and acceptance.   Dental Advisory Given  Plan Discussed with: CRNA  Anesthesia Plan Comments: (Lab work confirmed with CRNA in room. Platelets okay. Discussed spinal anesthetic, and patient consents to the procedure:  included risk of possible headache,backache, failed block, allergic reaction, and nerve injury. This patient was asked if she had any questions or concerns before the procedure started. )        Anesthesia Quick Evaluation

## 2016-04-07 LAB — CBC
HCT: 27.5 % — ABNORMAL LOW (ref 36.0–46.0)
Hemoglobin: 9 g/dL — ABNORMAL LOW (ref 12.0–15.0)
MCH: 26.6 pg (ref 26.0–34.0)
MCHC: 32.7 g/dL (ref 30.0–36.0)
MCV: 81.4 fL (ref 78.0–100.0)
PLATELETS: 311 10*3/uL (ref 150–400)
RBC: 3.38 MIL/uL — AB (ref 3.87–5.11)
RDW: 16.5 % — ABNORMAL HIGH (ref 11.5–15.5)
WBC: 14.1 10*3/uL — ABNORMAL HIGH (ref 4.0–10.5)

## 2016-04-07 MED ORDER — HYDROMORPHONE HCL 2 MG PO TABS
4.0000 mg | ORAL_TABLET | ORAL | Status: DC | PRN
  Administered 2016-04-07 – 2016-04-09 (×12): 4 mg via ORAL
  Filled 2016-04-07 (×12): qty 2

## 2016-04-07 NOTE — Anesthesia Postprocedure Evaluation (Signed)
Anesthesia Post Note  Patient: Kimberly Pace  Procedure(s) Performed: Procedure(s) (LRB): REPEAT CESAREAN SECTION WITH BILATERAL TUBAL LIGATION (Bilateral)  Patient location during evaluation: Mother Baby Anesthesia Type: Combined Spinal/Epidural Level of consciousness: awake, awake and alert and oriented Pain management: pain level not controlled (Encouraged patient to use PCA, asked RN to ask OB doctor for ofirmev order q 6 hours for 24 hours) Vital Signs Assessment: post-procedure vital signs reviewed and stable Respiratory status: spontaneous breathing, nonlabored ventilation and respiratory function stable Cardiovascular status: stable Postop Assessment: no headache, no backache, patient able to bend at knees, no signs of nausea or vomiting and adequate PO intake Anesthetic complications: no     Last Vitals:  Vitals:   04/07/16 0221 04/07/16 0559  BP:  (!) 100/48  Pulse:  62  Resp: 14 14  Temp:  36.7 C    Last Pain:  Vitals:   04/07/16 0715  TempSrc:   PainSc: 7    Pain Goal: Patients Stated Pain Goal: 5 (04/06/16 2157)               Gillian Kluever

## 2016-04-07 NOTE — Progress Notes (Signed)
UR chart review completed.  

## 2016-04-07 NOTE — Clinical Social Work Maternal (Signed)
CLINICAL SOCIAL WORK MATERNAL/CHILD NOTE  Patient Details  Name: Kimberly Pace MRN: [id number] Date of Birth: 04-10-1976  Date:  04/07/2016  Clinical Social Worker Initiating Note:  Laurey Arrow Date/ Time Initiated:  04/07/16/1217     Child's Name:  Kimberly Pace   Legal Guardian:  Mother   Need for Interpreter:  None   Date of Referral:  04/07/16     Reason for Referral:  Behavioral Health Issues, including SI , Current Substance Use/Substance Use During Pregnancy    Referral Source:  Central Nursery   Address:  [place] [address]  Phone number:  [phone]   Household Members:  Self, Significant Other, Minor Children   Natural Supports (not living in the home):  Spouse/significant other, Extended Family, Immediate Family   Professional Supports:  (MOB receives Suboxone medication at [employer]. )   Employment: Unemployed   Type of Work:     Education:  9 to 11 years   Museum/gallery curator Resources:  Medicaid   Other Resources:  Physicist, medical , [place] Considerations Which May Impact Care:  Per [employer], MOB is Engineer, manufacturing.  Strengths:  Ability to meet basic needs , Pediatrician chosen , Understanding of illness, Home prepared for child , Compliance with medical plan    Risk Factors/Current Problems:  Substance Use , Mental Health Concerns    Cognitive State:  Alert , Able to Concentrate , Linear Thinking , Insightful    Mood/Affect:  Bright , Happy , Comfortable , Interested    CSW Assessment: CSW met with MOB to complete an assessment of hx of anxiety and hx of substance use. MOB gave CSW permission to meet with MOB while FOB Earlene Plater) was present.  MOB was polite and interested in meeting with CSW. CSW inquired about MOB's SA hx and MOB reported the  use of marijuana throughout pregnancy.  MOB also reported that MOB receives Suboxone medication management at [facility] practice and MOB's next  scheduled appointment is 04/23/2016. MOB acknowledged MOB's last use of marijuana was about a week ago. CSW informed MOB of the hospital's drug screen policy, and informed MOB of the 2 screenings for the infant.  MOB stated that MOB was not concern and communicated that MOB understood the policy. CSW explained that the infant's UDS was negative, and CSW will monitor the infant's Cord.  CSW made MOB aware that CSW would be making a report to [facility] CPS if infant's cord screen is positive; MOB understood. CSW offered MOB additional SA resources and MOB declined.  CSW inquired about MOB's MH hx and MOB reported that MOB is not currently on any medications and denied symptoms of anxiety. CSW offered MOB outpatient resources and MOB declined. 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 MOB acknowledged reporting herself to [facility] CPS due to the severity of MOB's symptoms. MOB stated that MOB feels more prepared about being a mom again and the environment and situation is better than it was 10 years ago. CSW educated MOB about PPD. CSW informed MOB of possible supports and interventions to decrease PPD.  CSW also encouraged MOB to seek medical attention if needed for increased signs and symptoms for PPD.  CSW offered the family outpatient [place] services and parenting and MOB declined.  MOB communicated that MOB will reach out to MOB's providers if a need was warranted. CSW reviewed safe  sleep, and SIDS and MOB was knowledgeable and responded appropriate to CSW questions.  MOB communicated that she has a bassinet for the baby, and feels prepared for the infant.  MOB did not have any further questions, concerns, or needs at this time.  MOB spoke CSW extensively about marijuana being a natural substance and the research MOB and FOB have conducted regarding the use of marijuana prenatally.  Both parents were  understanding the the substance is illegal in [place], however they had a strong opinion about their personal usage. CSW listened and reintegrated the hospital's policy and procedures.   CSW Plan/Description:  Information/Referral to [employer] , [employer] , No Further Intervention Required/No Barriers to Discharge (CSW will follow infant's UDS and will make a report to [place].)   Laurey Arrow, MSW, LCSW Clinical Social Work [phone]    Dimple Nanas, LCSW 04/07/2016, 12:23 PM

## 2016-04-07 NOTE — Lactation Note (Signed)
This note was copied from a baby's chart. Lactation Consultation Note  Patient Name: Kimberly Pace [id number] Date: 04/07/2016   Visited with Mom, baby 50 hrs old.  Mom was on her phone, and distracted when questions asked.  Mom states BFing is going well, but she has some pain with it.  Stated that baby had just fed, and was now in crib sleeping.  Recommended she request assistance with next feeding to make sure baby is getting on with a deep, wide latch.  Recommended she place baby STS, to encouraged more feedings at the breast.  Mom stated she should, and then answered her phone.  Lactation to follow up prn and 11/14.      Broadus John 04/07/2016, 12:48 PM

## 2016-04-07 NOTE — Progress Notes (Signed)
Subjective: Postpartum Day 1: Cesarean Delivery Patient reports incisional pain, tolerating PO and + flatus.    Objective: Vital signs in last 24 hours: Temp:  [97.6 F (36.4 C)-98.5 F (36.9 C)] 98.1 F (36.7 C) (11/13 0559) Pulse Rate:  [56-86] 62 (11/13 0559) Resp:  [12-22] 14 (11/13 0559) BP: (66-139)/(36-83) 100/48 (11/13 0559) SpO2:  [94 %-100 %] 99 % (11/13 0559) FiO2 (%):  [21 %] 21 % (11/12 1451) Weight:  [214 lb (97.1 kg)] 214 lb (97.1 kg) (11/12 [id number])  Physical Exam:  General: alert, cooperative, appears stated age and no distress Lochia: appropriate Uterine Fundus: firm Incision: no significant drainage DVT Evaluation: No evidence of DVT seen on physical exam.   Recent Labs  04/06/16 0730 04/07/16 0516  HGB 10.8* 9.0*  HCT 32.4* 27.5*    Assessment/Plan: Status post Cesarean section. Doing well postoperatively.  Continue current care.  Koren Shiver 04/07/2016, 7:03 AM

## 2016-04-07 NOTE — Lactation Note (Signed)
This note was copied from a baby's chart. Lactation Consultation Note Mom BF her now 38 yr old for 3 months. Didn't BF her 22 yr old. Mom wishes to BF this baby. Mom had support person sleeping in rm. And mom very sleepy. Unable to discussed BF and taking subutex as well as THC. Mom has PCA pump d/t c/s and uncontrolled pain after delivery. Pain is better controlled at this time.  Mom has large pendulum soft breast. Everted nipples. Mom states breast are very tender. Mom states when she goes into the cold weather they hurt. She has sharp pain in her breast. Moms rm. Is very cold at time. No complaints of sharp pains. Just tenderness as usual since pregnant. Just a glisten noted of colostrum. Mom laying and to tender to hand express at this time. RN assisted in latch. Mom stated BF well.  Referred to Baby and Me Book in Breastfeeding section Pg. 22-23 for position options and Proper latch demonstration. Mom encouraged to feed baby 8-12 times/24 hours and with feeding cues. Educated about newborn behavior, I&O, STS. [place] brochure given w/resources, support groups and [place] services. Mom has Lovilia. Patient Name: Kimberly Pace [id number] Date: 04/07/2016 Reason for consult: Initial assessment   Maternal Data Has patient been taught Hand Expression?: Yes Does the patient have breastfeeding experience prior to this delivery?: Yes  Feeding Feeding Type: Breast Fed Length of feed: 20 min  LATCH Score/Interventions Latch: Grasps breast easily, tongue down, lips flanged, rhythmical sucking. Intervention(s): Adjust position;Assist with latch;Breast compression  Audible Swallowing: A few with stimulation  Type of Nipple: Everted at rest and after stimulation  Comfort (Breast/Nipple): Soft / non-tender     Hold (Positioning): Assistance needed to correctly position infant at breast and maintain latch. Intervention(s): Breastfeeding basics reviewed;Support Pillows;Skin to skin;Position  options  LATCH Score: 8  Lactation Tools Discussed/Used WIC Program: Yes   Consult Status Consult Status: Follow-up Date: 04/07/16 Follow-up type: In-patient    Theodoro Kalata 04/07/2016, 3:34 AM

## 2016-04-07 NOTE — Addendum Note (Signed)
Addendum  created 04/07/16 [id number] by Asher Muir, CRNA   Charge Capture section accepted

## 2016-04-08 NOTE — Progress Notes (Signed)
Subjective: Postpartum Day 2: Cesarean Delivery Patient reports incisional pain, tolerating PO and no problems voiding.    Objective: Vital signs in last 24 hours: Temp:  [97.9 F (36.6 C)-98.2 F (36.8 C)] 98 F (36.7 C) (11/14 0555) Pulse Rate:  [68-76] 68 (11/14 0555) Resp:  [16-19] 18 (11/14 0555) BP: (98-116)/(52-75) 116/64 (11/14 0555) SpO2:  [98 %-100 %] 100 % (11/13 1952)  Physical Exam:  General: alert, cooperative and no distress Lochia: appropriate Uterine Fundus: firm Incision: healing well, no significant drainage DVT Evaluation: No evidence of DVT seen on physical exam.   Recent Labs  04/06/16 0730 04/07/16 0516  HGB 10.8* 9.0*  HCT 32.4* 27.5*    Assessment/Plan: Status post Cesarean section. Doing well postoperatively.  Continue current care Plan discharge tomorrow.  [facility] 04/08/2016, 8:51 AM

## 2016-04-08 NOTE — Lactation Note (Signed)
This note was copied from a baby's chart. Lactation Consultation Note  Patient Name: Kimberly Pace [id number] Date: 04/08/2016   Baby 41 hours old. Mom reports that baby is out of the room for a scan. Mom states that her left breast is sore, though not bleeding. Mom reports that she nursed her first child, but did not nurse the others. Mom states that her milk "came in" with each child. Enc mom to call for [facility] when baby back in the room and cueing to nurse.   Maternal Data    Feeding Feeding Type: Bottle Fed - Formula  LATCH Score/Interventions                      Lactation Tools Discussed/Used     Consult Status      Kimberly Pace 04/08/2016, 11:55 AM

## 2016-04-08 NOTE — Lactation Note (Signed)
This note was copied from a baby's chart. Lactation Consultation Note Baby had 8% weight loss in 34 hrs old. BF well. Large out put LC feels to be the cause of weight loss. 9 Voids, 6 stools and several emesis. Patient Name: Girl Shambria Lemasters [id number] Date: 04/08/2016 Reason for consult: Follow-up assessment;Infant weight loss   Maternal Data    Feeding Feeding Type: Breast Fed Length of feed: 30 min  LATCH Score/Interventions                      Lactation Tools Discussed/Used     Consult Status Consult Status: Follow-up Date: 04/08/16 Follow-up type: In-patient    Theodoro Kalata 04/08/2016, 12:33 AM

## 2016-04-09 ENCOUNTER — Ambulatory Visit: Payer: Self-pay

## 2016-04-09 MED ORDER — HYDROMORPHONE HCL 4 MG PO TABS: 4.0000 mg | ORAL_TABLET | ORAL | 0 refills | Status: AC | PRN

## 2016-04-09 MED ORDER — IBUPROFEN 600 MG PO TABS: 600.0000 mg | ORAL_TABLET | Freq: Four times a day (QID) | ORAL | 0 refills | Status: AC

## 2016-04-09 MED ORDER — SENNOSIDES-DOCUSATE SODIUM 8.6-50 MG PO TABS: 2.0000 | ORAL_TABLET | ORAL | 0 refills | Status: AC

## 2016-04-09 NOTE — Lactation Note (Signed)
This note was copied from a baby's chart. Lactation Consultation Note  Patient Name: Kimberly Pace Date: 04/09/2016 Reason for consult: Follow-up assessment Mom plans to "go back on pain meds". Discussed the risk of formula vs the benefits of bf on medication while being followed by a doctor. Discussed the risk of taking illegal medication/drugs while bf. Reviewed information from Dr. Clent Jacks "Medication in Mothers Milk" book on prescribed medications. She is worried that taking Dilaudid and subutex/suboxone "will mess up the baby". Mom stated she is going back to her MD in [facility], but stated she is looking for a new doctor. She stated that her Gaspar Cola MD and GP are both trying to "wean me off the meds but I need to find somebody that will give them to me long term". She currently sees no other way to manage her chronic pain than with opioids. After talking with Dr. Anastasio Champion, mom was encouraged to wean baby. Dr. Anastasio Champion wants mom to be full formula feeding by D/C. Mom will offer 3 bf then 1 formula feeding today, tomorrow 2bf then 1 formula feeding, by Friday mom will be offering 1bf then 1 formula. When mom gets home she will be full formula feeding. Reviewed breast changes and milk management during evolution. Discussed paced formula feeding and formula handling/storage. Mom currently has bilateral nipple soreness. The L nipple has a small circular bruise on the upper outer quadrant. The R nipple has a circular area of skin break down in the middle of the nipple. Given comfort gels. Mom would like to continue latching baby to the L breast and pumping the R breast until baby is off breast milk completely. She is aware of lactation services and support group. She will call as needed.   Maternal Data    Feeding Feeding Type: Bottle Fed - Formula Nipple Type: Slow - flow Length of feed: 15 min  LATCH Score/Interventions Latch: Grasps breast easily, tongue down, lips flanged,  rhythmical sucking. Intervention(s): Adjust position  Audible Swallowing: Spontaneous and intermittent  Type of Nipple: Everted at rest and after stimulation  Comfort (Breast/Nipple): Filling, red/small blisters or bruises, mild/mod discomfort  Problem noted: Mild/Moderate discomfort;Cracked, bleeding, blisters, bruises Interventions  (Cracked/bleeding/bruising/blister): Expressed breast milk to nipple Interventions (Mild/moderate discomfort): Comfort gels  Hold (Positioning): Assistance needed to correctly position infant at breast and maintain latch. Intervention(s): Position options;Support Pillows  LATCH Score: 8  Lactation Tools Discussed/Used     Consult Status Consult Status: Follow-up Date: 04/10/16 Follow-up type: In-patient    Denzil Hughes 04/09/2016, 8:08 PM

## 2016-04-09 NOTE — Discharge Summary (Signed)
OB Discharge Summary     Patient Name: Kimberly Pace DOB: 30-Aug-1975 MRN: [id number]  Date of admission: 04/06/2016 Delivering MD: Lavonia Drafts   Date of discharge: 04/09/2016  Admitting diagnosis: cpt 59514 and 58611 - REPEAT c-section  x's 4 and undesired fertility Intrauterine pregnancy: [redacted]w[redacted]d     Secondary diagnosis:  Active Problems:   Postoperative state  Additional problems:      Discharge diagnosis: Term Pregnancy Delivered                                                                                                Post partum procedures:postpartum tubal ligation  Augmentation: n/a  Complications: None  Hospital course:  Sceduled C/S   40 y.o. yo [id number] at [redacted]w[redacted]d was admitted to the hospital 04/06/2016 for scheduled cesarean section with the following indication:Elective Repeat.  Membrane Rupture Time/Date: 9:54 AM ,04/06/2016   Patient delivered a Viable infant.04/06/2016  Details of operation can be found in separate operative note.  Pateint had an uncomplicated postpartum course.  She is ambulating, tolerating a regular diet, passing flatus, and urinating well. Patient is discharged home in stable condition on  04/09/16          Physical exam  Vitals:   04/07/16 1952 04/08/16 0555 04/08/16 1800 04/09/16 0522  BP: (!) 108/52 116/64 134/74 128/65  Pulse: 70 68 78 66  Resp: 16 18 18 18   Temp: 98.2 F (36.8 C) 98 F (36.7 C) 97.8 F (36.6 C) 97.8 F (36.6 C)  TempSrc: Oral Oral Oral Oral  SpO2: 100%     Weight:      Height:       General: alert, cooperative and no distress Lochia: appropriate Uterine Fundus: firm Incision: Dressing is clean, dry, and intact DVT Evaluation: No evidence of DVT seen on physical exam. Labs: Lab Results  Component Value Date   WBC 14.1 (H) 04/07/2016   HGB 9.0 (L) 04/07/2016   HCT 27.5 (L) 04/07/2016   MCV 81.4 04/07/2016   PLT 311 04/07/2016   CMP Latest Ref Rng & Units 06/16/2013  Glucose 70 - 99  mg/dL 100(H)  BUN 6 - 23 mg/dL 11  Creatinine 0.50 - 1.10 mg/dL 0.50  Sodium 137 - 147 mEq/L 137  Potassium 3.7 - 5.3 mEq/L 4.1  Chloride 96 - 112 mEq/L 101  CO2 19 - 32 mEq/L 20  Calcium 8.4 - 10.5 mg/dL 9.7  Total Protein 6.0 - 8.3 g/dL 8.3  Total Bilirubin 0.3 - 1.2 mg/dL 0.4  Alkaline Phos 39 - 117 U/L 68  AST 0 - 37 U/L 12  ALT 0 - 35 U/L 12    Discharge instruction: per After Visit Summary and "Baby and Me Booklet".  After visit meds:    Medication List    TAKE these medications   docusate sodium 100 MG capsule Commonly known as:  COLACE Take 100 mg by mouth 2 (two) times daily.   GOODSENSE PRENATAL VITAMINS 28-0.8 MG Tabs Take 1 tablet by mouth at bedtime.   HYDROmorphone 4 MG tablet Commonly known as:  DILAUDID Take 1 tablet (  4 mg total) by mouth every 4 (four) hours as needed for severe pain.   ibuprofen 600 MG tablet Commonly known as:  ADVIL,MOTRIN Take 1 tablet (600 mg total) by mouth every 6 (six) hours.   polyethylene glycol packet Commonly known as:  MIRALAX / GLYCOLAX Take 17 g by mouth daily as needed for mild constipation.   senna-docusate 8.6-50 MG tablet Commonly known as:  Senokot-S Take 2 tablets by mouth daily. Start taking on:  04/10/2016   SUBOXONE 8-2 MG Film Generic drug:  Buprenorphine HCl-Naloxone HCl Place 1 Film under the tongue daily.       Diet: routine diet  Activity: Advance as tolerated. Pelvic rest for 6 weeks.   Outpatient follow up:6 weeks Follow up Appt:No future appointments. Follow up Visit:No Follow-up on file.  Postpartum contraception: Tubal Ligation  Newborn Data: Live born female  Birth Weight: 8 lb 4.8 oz (3765 g) APGAR: 9, 9  Baby Feeding: Bottle and Breast Disposition:home with mother   04/09/2016 Steve Rattler, DO  OB FELLOW DISCHARGE ATTESTATION  I have seen and examined this patient and agree with above documentation in the resident's note.   Jacquiline Doe, MD 10:04 AM

## 2016-04-09 NOTE — Discharge Instructions (Signed)

## 2016-04-09 NOTE — Lactation Note (Signed)
This note was copied from a baby's chart. Lactation Consultation Note  Patient Name: Kimberly Pace [id number] Date: 04/09/2016    Infant being fed bottle; Mom was finishing a phone conversation & looks distressed. Will return later.  Matthias Hughs [facility] 04/09/2016, 3:00 PM

## 2016-04-10 ENCOUNTER — Ambulatory Visit: Payer: Self-pay

## 2016-04-10 LAB — TYPE AND SCREEN
ABO/RH(D): O POS
Antibody Screen: NEGATIVE
UNIT DIVISION: 0
UNIT DIVISION: 0

## 2016-04-10 NOTE — Lactation Note (Signed)
This note was copied from a baby's chart. Lactation Consultation Note  Mother has scabs on both nipples but mother states they are improving and less sore.  She has comfort gels. Provided mother w/ coconut oil to alternate with. Mother has a written plan that she has discussed with her MD on how she will wean baby alternating bf with formula.  Last breastfeeding will happen at approx 1500 on 11/17. Discussed that if baby cluster feeds, she would offer formula at those feedings in between planned feedings. Mother was told in addition to cabbage leaves and ice she was told she could pump and dump to wean. Discussed if she is wanting to fully wean not to add pumping because it will stimulate her milk supply to continue to produce. Suggest she call if she needs further assistance.  Patient Name: Kimberly Pace [id number] Date: 04/10/2016     Maternal Data    Feeding Feeding Type: Bottle Fed - Formula Nipple Type: Slow - flow  LATCH Score/Interventions                      Lactation Tools Discussed/Used     Consult Status      Carlye Grippe 04/10/2016, 5:09 PM

## 2016-04-12 ENCOUNTER — Ambulatory Visit: Payer: Self-pay

## 2016-04-12 NOTE — Lactation Note (Signed)
This note was copied from a baby's chart. Lactation Consultation Note  Mother's breasts are becoming engorged.  Provided mother with ice packs. Per mom the infant's next feeding will be the last bottle of breastmilk. Mother will not start cabbage leaves until baby is observed longer for withdrawal symptoms. She has 3 bottles of breastmilk in refrigerator if needed. Suggest she start reducing pumping frequency and only for comfort approx 5 min. Mother states nipple soreness has improved. Encouraged her to call if she needs further assistance.  Patient Name: Kimberly Pace [id number] Date: 04/12/2016 Reason for consult: Follow-up assessment   Maternal Data    Feeding    LATCH Score/Interventions                      Lactation Tools Discussed/Used     Consult Status Consult Status: PRN    Carlye Grippe 04/12/2016, 8:46 AM

## 2016-04-12 NOTE — Lactation Note (Addendum)
This note was copied from a baby's chart. Lactation Consultation Note  Mother has decided she wants to start drying up milk today after conversation with Pediatrician instead of pumping any longer. Discussed how providing breastmilk could help baby with withdrawals - there is breastmilk stored in hospital refrigerator.  Mother states she wants cabbage leaves and per discussion with Pediatrician she will stop giving baby breastmilk. Provided her with cabbage leaves, breast pads and instructions.  Patient Name: Girl Kimberly Pace [id number] Date: 04/12/2016 Reason for consult: Follow-up assessment   Maternal Data    Feeding Feeding Type: Bottle Fed - Formula Nipple Type: Slow - flow  LATCH Score/Interventions                      Lactation Tools Discussed/Used     Consult Status Consult Status: PRN    Carlye Grippe 04/12/2016, 9:55 AM

## 2016-05-13 ENCOUNTER — Ambulatory Visit: Payer: Medicaid Other | Admitting: Student

## 2016-06-09 ENCOUNTER — Encounter (HOSPITAL_COMMUNITY): Payer: Self-pay

## 2017-04-29 IMAGING — US US OB COMP LESS 14 WK
1 series · 15 of 28 positions shown · non-contrast
Comparison: No prior scans from this gestation.

CLINICAL DATA: 40-year-old pregnant female presenting with few
hours of vaginal bleeding. Quantitative beta HCG [DATE].

EDC by LMP: 04/13/2016, projecting to an expected gestational age of
9 weeks 6 days.
EXAM:
OBSTETRIC <14 WK ULTRASOUND
TECHNIQUE: Transabdominal ultrasound was performed for evaluation of the
gestation as well as the maternal uterus and adnexal regions.

[Series 1: us ob comp less 14 wk · 35 acquisitions, 15 frames shown]
[im 1/35]
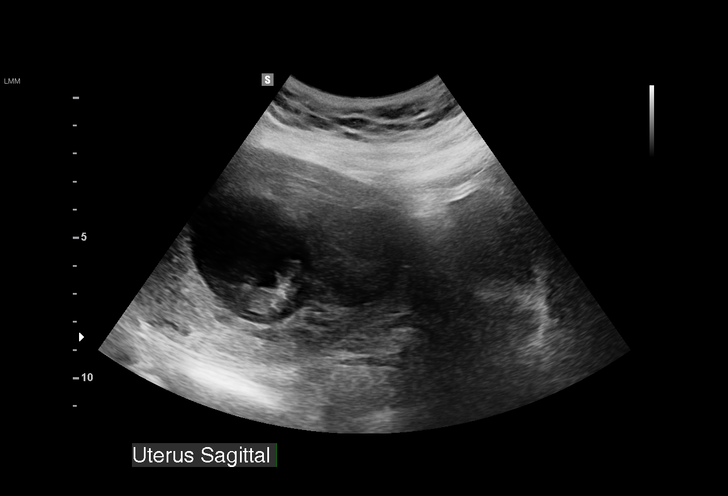
[im 3/35]
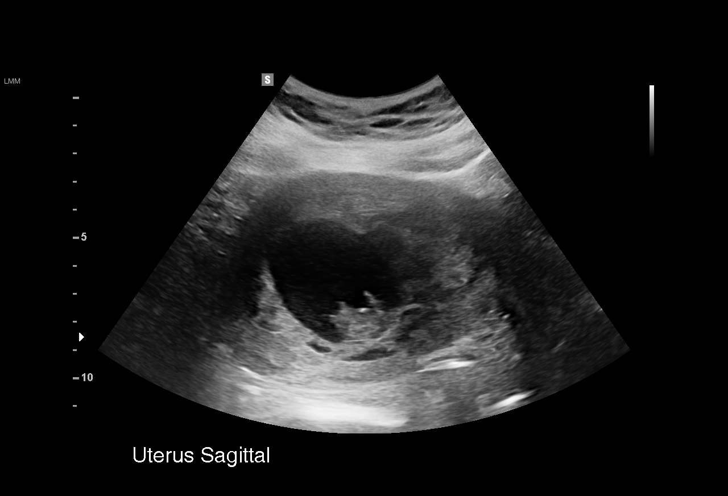
[im 6/35]
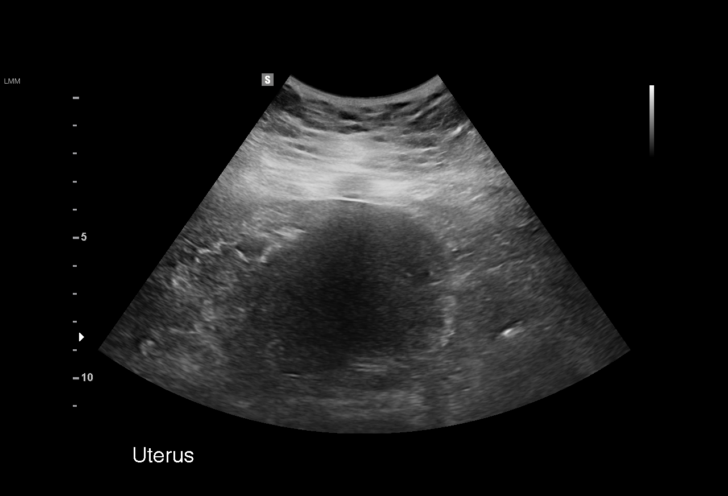
[im 8/35]
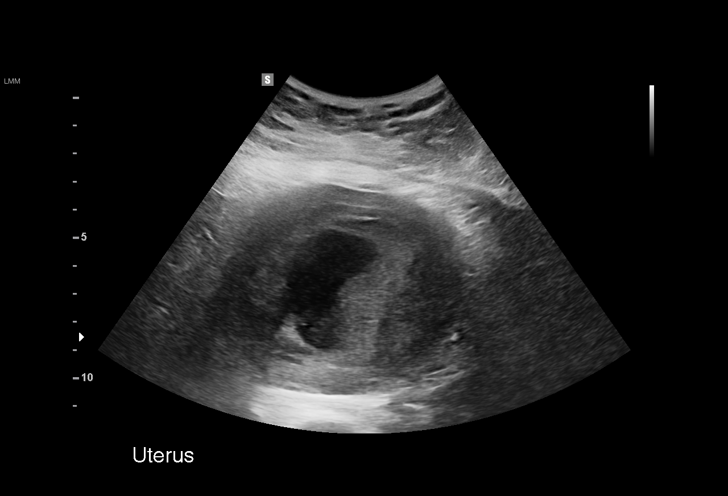
[im 11/35]
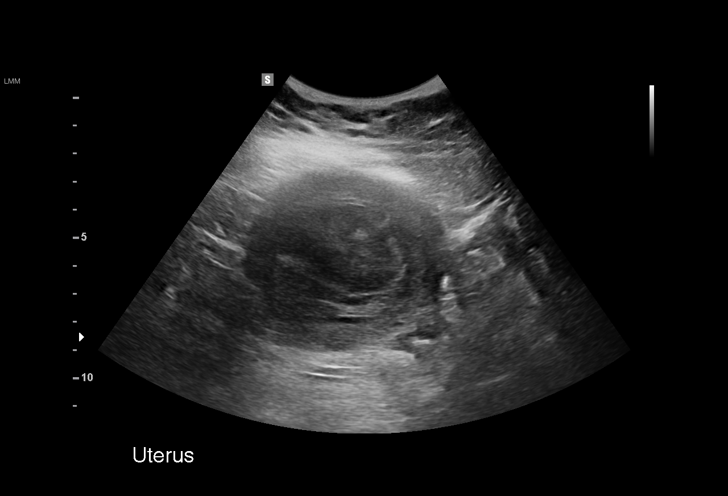
[im 13/35]
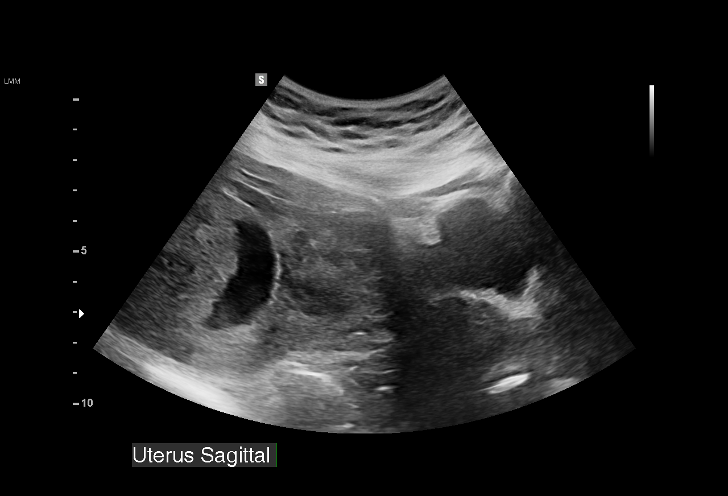
[im 16/35]
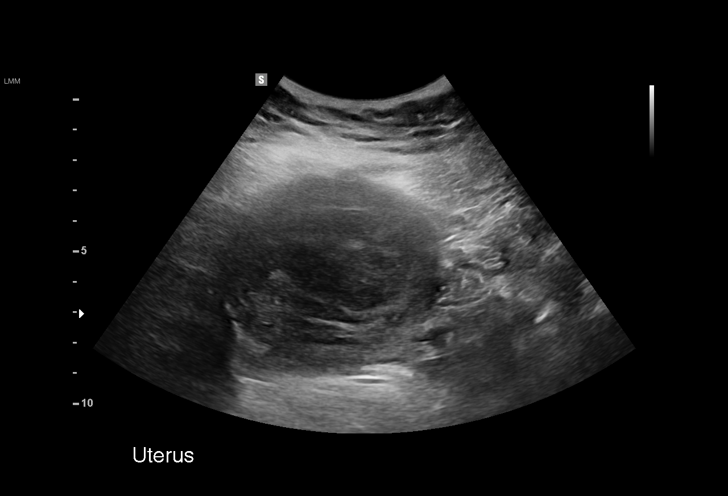
[im 18/35]
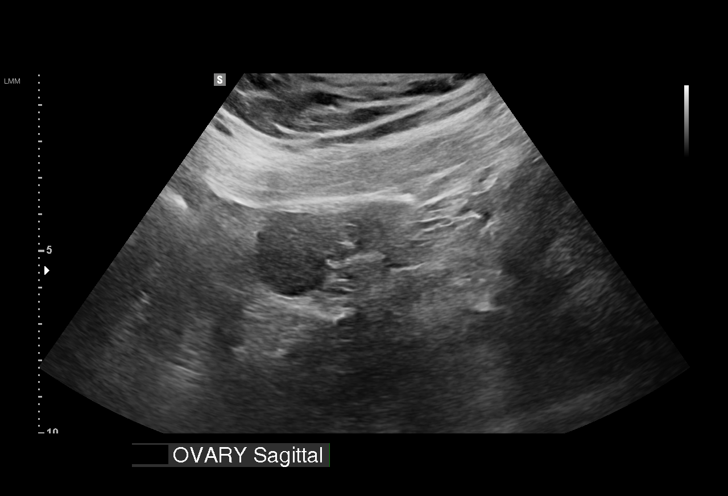
[im 19/35]
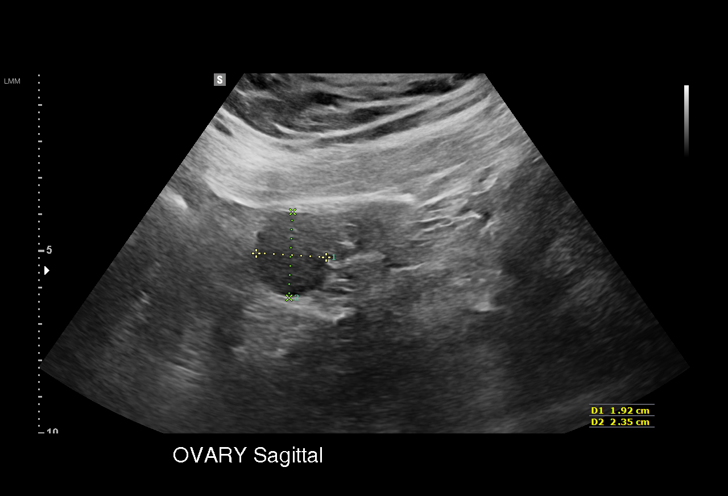
[im 22/35]
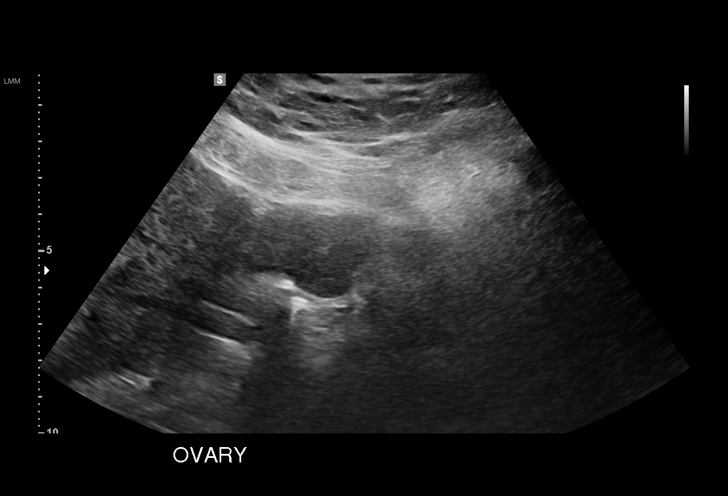
[im 24/35]
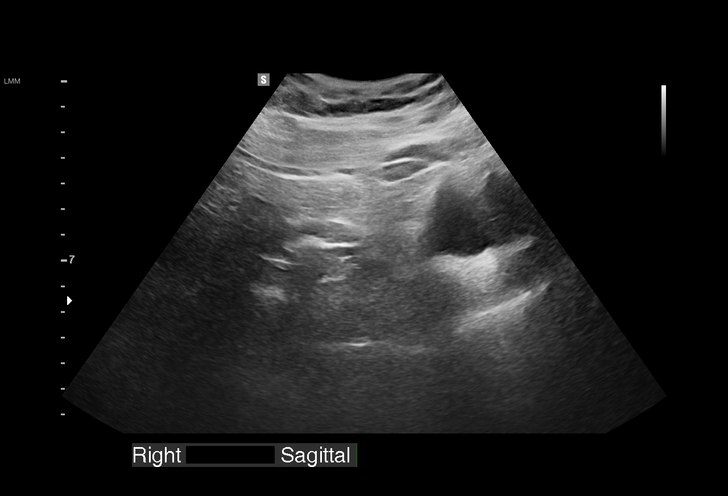
[im 27/35]
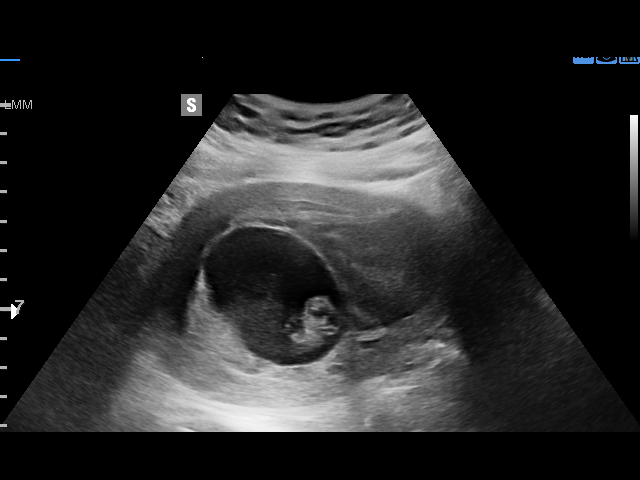
[im 29/35]
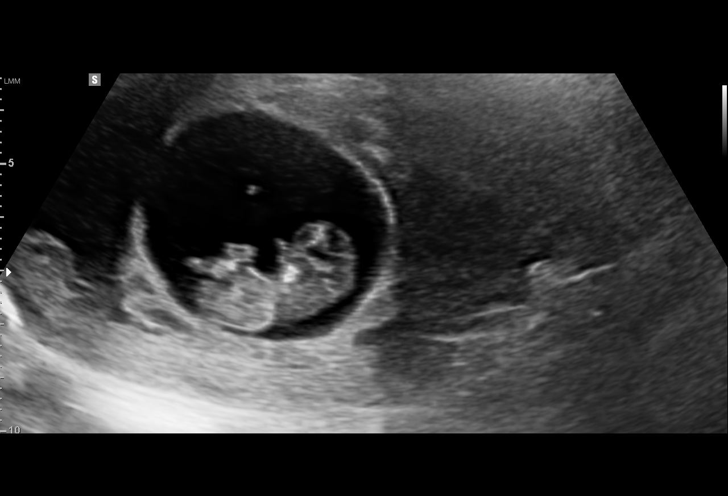
[im 32/35]
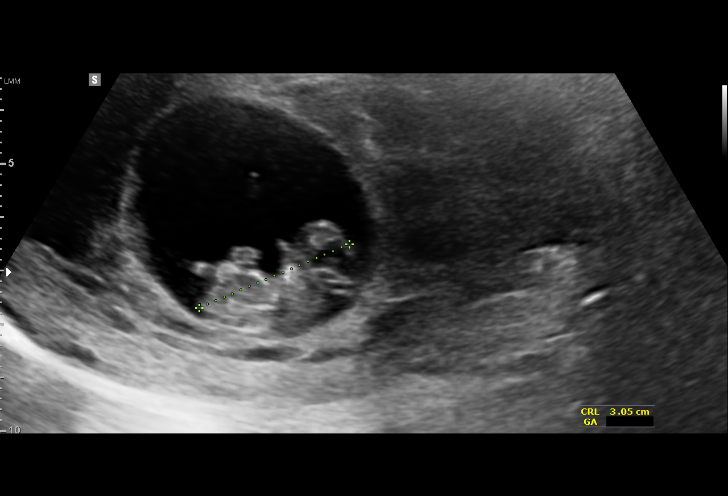
[im 35/35]
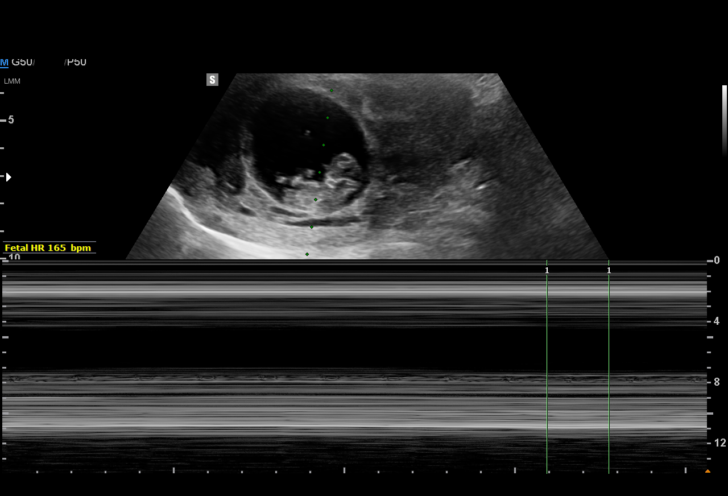

[15 of 28 positions shown; findings below may reference images not displayed]

FINDINGS: Transabdominal ultrasound images are limited by patient related
factors. Transvaginal scan was declined by the patient.

Intrauterine gestational sac: Single intrauterine gestational sac
appears normal in size, shape and position.

Yolk sac:  Not visualized.

Fetus: Present. Fetal anatomy not assessed at this early gestational
age.

Fetal Cardiac Activity: Regular rate and rhythm.

Fetal Heart Rate: 165 bpm

CRL:   31.4  mm   10 w 0 d                  US EDC: 04/12/2016

Subchorionic hemorrhage: There is a moderate to large
perigestational bleed involving approximately 70-80% of the
gestational sac circumference, with an apparent 3.4 x 3.8 x 4.5 cm
hematoma in the lower endometrial cavity.

Maternal uterus/adnexae: Left ovary measures 1.9 x 2.4 x 2.5 cm.
Right ovary not visualized. No adnexal masses. No abnormal free
fluid the pelvis.
IMPRESSION: 1. Single living intrauterine gestation at 10 weeks 0 days by
crown-rump length, concordant with menstrual dating. Normal fetal
cardiac activity.
2. Moderate to large perigestational bleed as described. A follow-up
obstetric scan is recommended in 4 weeks to assess fetal growth
given this high-risk finding.
3. No adnexal abnormalities.

## 2017-08-03 IMAGING — US US MFM OB FOLLOW-UP
1 series · 13 of 28 positions shown · non-contrast
Comparison: none

OB/Gyn Clinic
[REDACTED]-
Faculty Physician

1  GEBELS FIETZ            [id number]      [id number]     [id number]
Indications
23 weeks gestation of pregnancy
Prior poor obstetrical history antepartum,
first trimester
Drug use complicating pregnancy, second
trimester
History of cesarean delivery, currently
pregnant
Advanced maternal age multigravida 35+,
Follow-up incomplete fetal anatomic            Z36
evaluation
Suboxone use
OB History
Gravidity:    6         Term:   3         SAB:   2
Ectopic:      1        Living:  2
Fetal Evaluation
Num Of Fetuses:     1
Fetal Heart         127
Rate(bpm):
Cardiac Activity:   Observed
Presentation:       Variable
Placenta:           Posterior Fundal, above cervical os
P. Cord Insertion:  Previously Visualized
Amniotic Fluid
AFI FV:      Subjectively within normal limits
Largest Pocket(cm)
5.2
Biometry
BPD:      59.3  mm     G. Age:  24w 2d         69  %    CI:        80.05   %   70 - 86
FL/HC:      20.5   %   18.7 -
HC:      209.4  mm     G. Age:  23w 0d         17  %    HC/AC:      1.02       1.05 -
AC:      204.7  mm     G. Age:  25w 0d         84  %    FL/BPD:     72.5   %   71 - 87
FL:         43  mm     G. Age:  24w 1d         54  %    FL/AC:      21.0   %   20 - 24
Est. FW:     698  gm      1 lb 9 oz     68  %
Gestational Age
LMP:           23w 4d       Date:   07/08/15                 EDD:   04/13/16
U/S Today:     24w 1d                                        EDD:   04/09/16
Best:          23w 4d    Det. By:   LMP  (07/08/15)          EDD:   04/13/16
Anatomy
Cranium:               Appears normal         LVOT:                   Previously seen
Cavum:                 Previously seen        Aortic Arch:            Previously seen
Ventricles:            Appears normal         Ductal Arch:            Appears normal
Choroid Plexus:        Previously seen        Diaphragm:              Appears normal
Cerebellum:            Previously seen        Stomach:                Appears normal, left
sided
Posterior Fossa:       Previously seen        Abdomen:                Appears normal
Nuchal Fold:           Previously seen        Abdominal Wall:         Previously seen
Face:                  Orbits and profile     Cord Vessels:           Previously seen
previously seen
Lips:                  Previously seen        Kidneys:                Appear normal
Palate:                Previously seen        Bladder:                Appears normal
Thoracic:              Appears normal         Spine:                  Previously seen
Heart:                 Appears normal         Upper Extremities:      Previously seen
(4CH, axis, and situs
RVOT:                  Appears normal         Lower Extremities:      Previously seen
Other:  Heels and 5th digit previously seen as normal. Technically difficult
due to maternal habitus. Parents do not wish to know sex of fetus.
Fetus appears to be a female.
Cervix Uterus Adnexa
Cervix
Length:            4.4  cm.
Normal appearance by transabdominal scan.
Impression
INDICATION: 40 yr old WYIFLLL at 63w0d with chronic pain on
suboxone and previous child with polycystic kidney disease
with neonatal demise for fetal growth and complete anatomy.

[Series 1: us mfm ob follow-up · 13 of 47 slices shown]
[im 2/47]
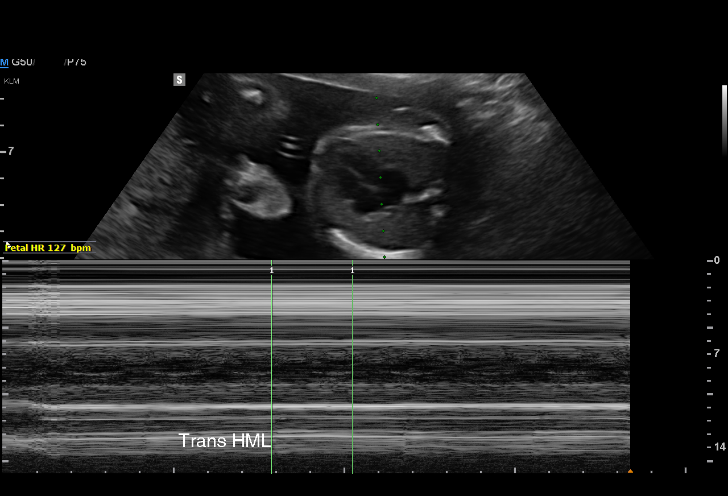
[im 6/47]
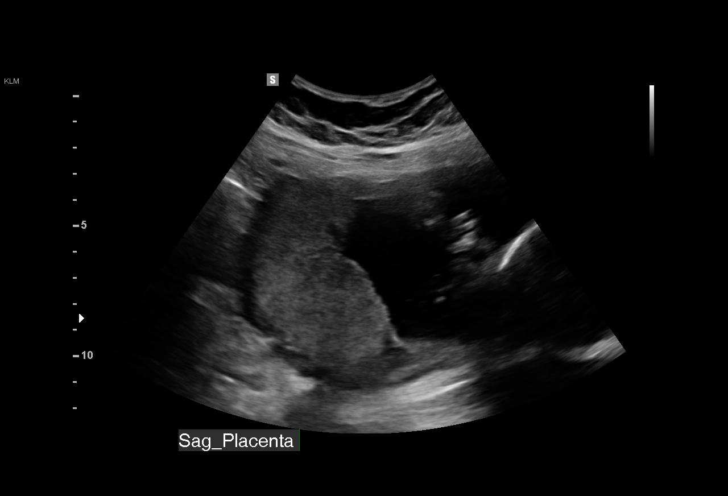
[im 9/47]
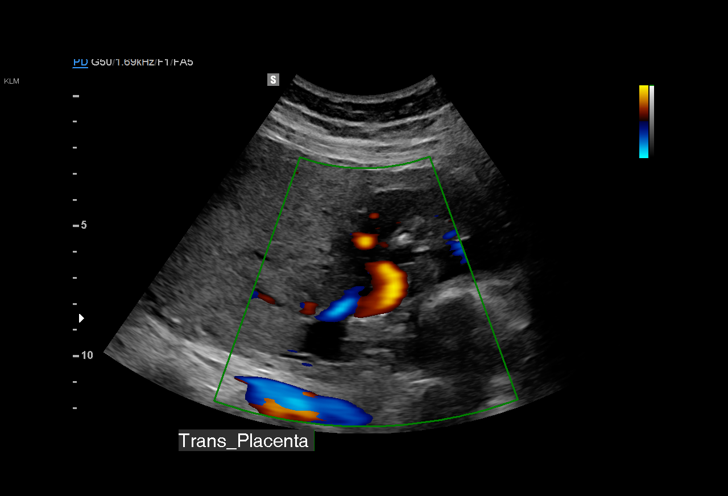
[im 12/47]
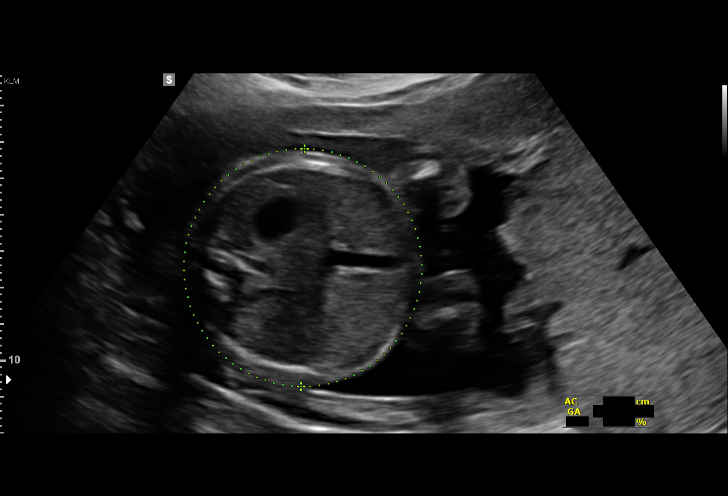
[im 16/47]
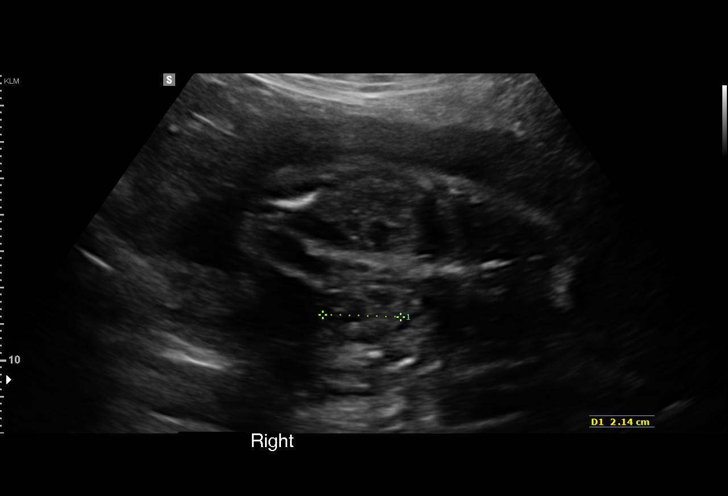
[im 19/47]
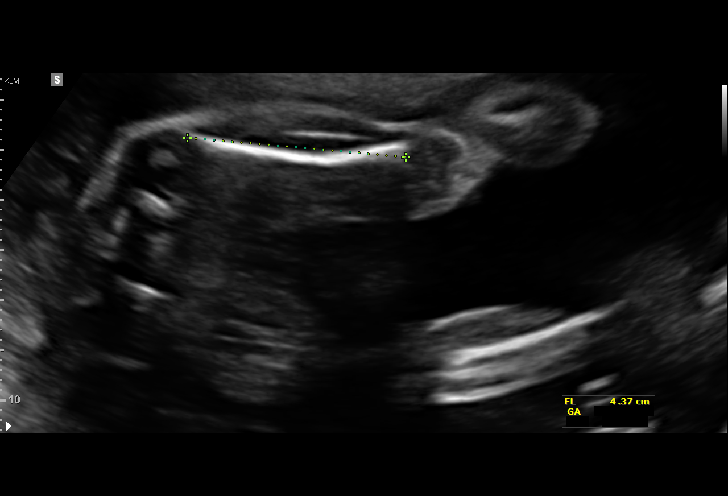
[im 24/47]
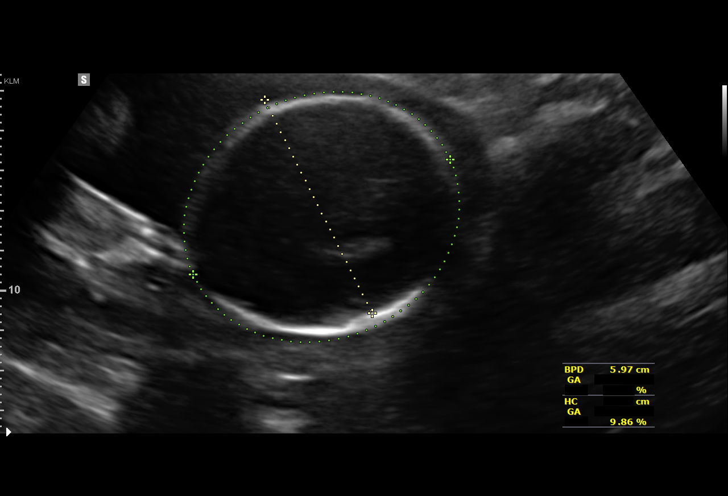
[im 28/47]
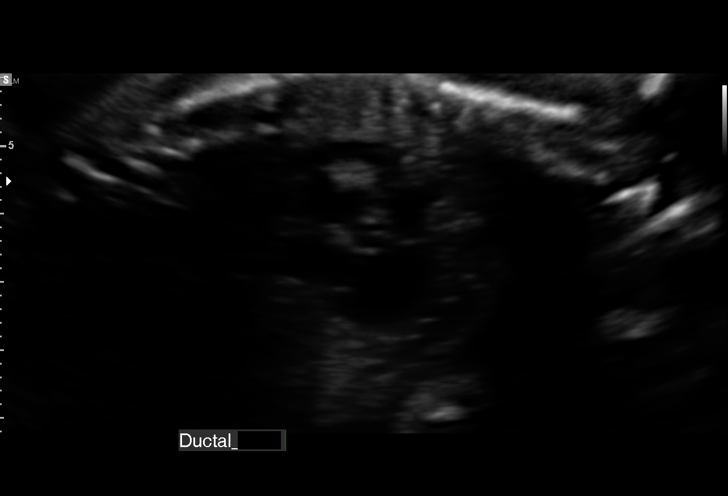
[im 31/47]
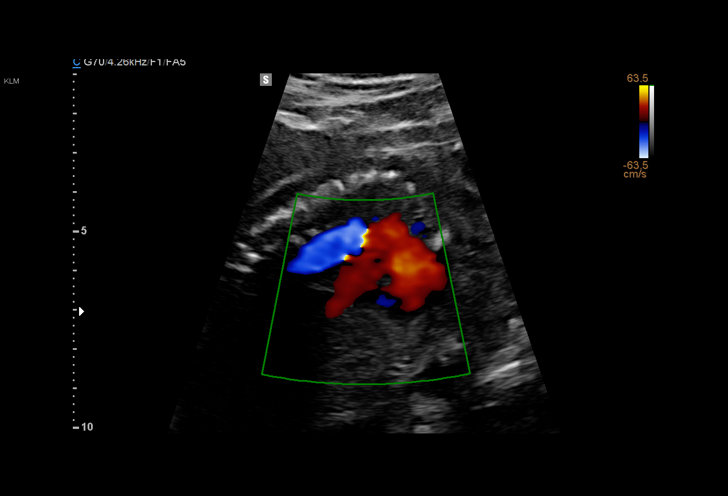
[im 35/47]
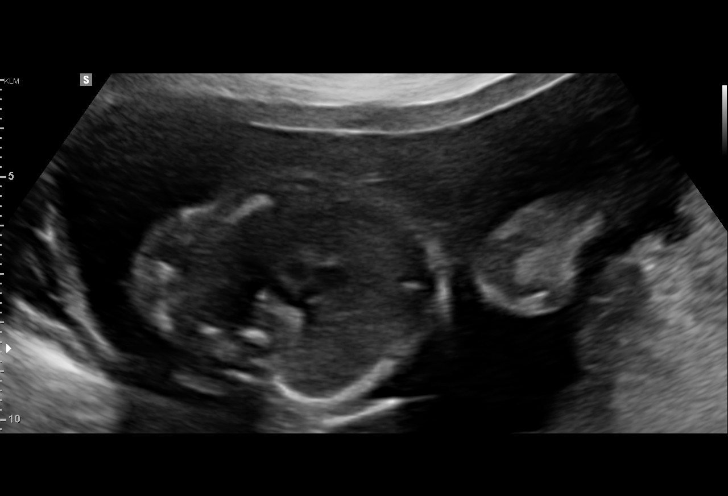
[im 38/47]
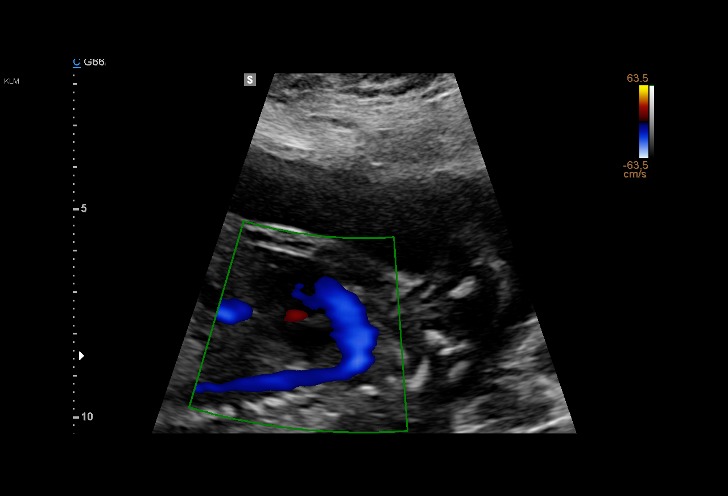
[im 41/47]
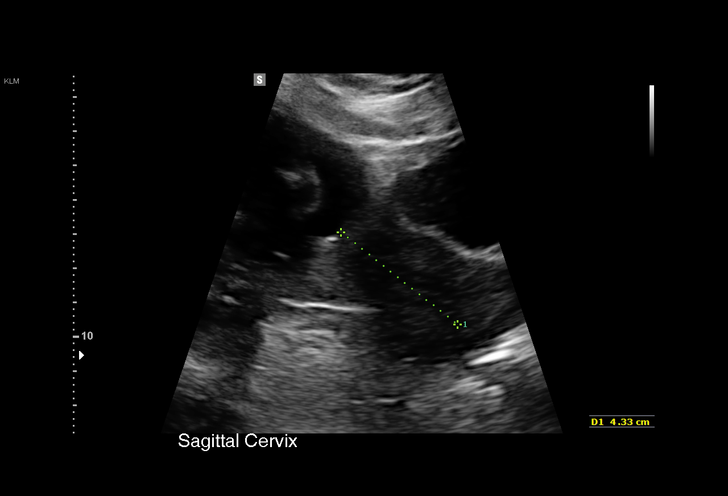
[im 45/47]
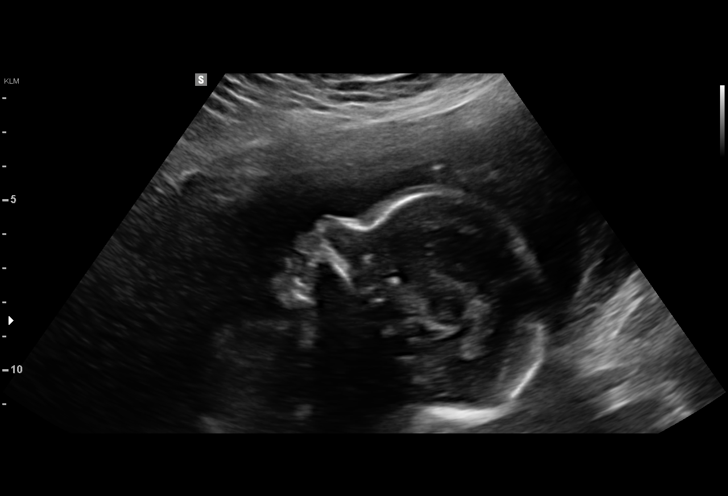

[13 of 28 positions shown; findings below may reference images not displayed]

FINDINGS: 1. Single intrauterine pregnancy.
2. Estimated fetal weight is in the 68th%.
3. Posterior fundal placenta without evidence of previa.
4. Normal amniotic fluid volume.
5. The limited anatomy survey is normal. Any anatomy not
evaluated on today's exam was evaluated on the previous
exam.
Recommendations

1. Appropriate fetal growth.
2. Fetal anatomic survey is complete.
3. Advanced maternal age:
- previously counseled
- had low risk quad screen
4. Chronic pain on suboxone:
- recommend fetal growth every 4 weeks
- inform Pediatrics at delivery
5. Previous child with polycystic kidney disease:
- previously counseled
- normal fluid seen on today's exam
- follow every 4 weeks as above to evaluate fluid and kidney
appearance
- different father of the baby

## 2017-10-28 ENCOUNTER — Emergency Department (HOSPITAL_COMMUNITY)
Admission: EM | Admit: 2017-10-28 | Discharge: 2017-10-28 | Disposition: A | Discharge status: Home / Self Care | Payer: Self-pay | Attending: Emergency Medicine | Admitting: Emergency Medicine

## 2017-10-28 ENCOUNTER — Encounter (HOSPITAL_COMMUNITY): Payer: Self-pay | Admitting: Emergency Medicine

## 2017-10-28 ENCOUNTER — Other Ambulatory Visit: Payer: Self-pay

## 2017-10-28 ENCOUNTER — Emergency Department (HOSPITAL_COMMUNITY): Payer: Self-pay

## 2017-10-28 DIAGNOSIS — I10 Essential (primary) hypertension: Secondary | ICD-10-CM | POA: Insufficient documentation

## 2017-10-28 DIAGNOSIS — K5900 Constipation, unspecified: Secondary | ICD-10-CM | POA: Insufficient documentation

## 2017-10-28 DIAGNOSIS — R1033 Periumbilical pain: Secondary | ICD-10-CM | POA: Insufficient documentation

## 2017-10-28 DIAGNOSIS — F17210 Nicotine dependence, cigarettes, uncomplicated: Secondary | ICD-10-CM | POA: Insufficient documentation

## 2017-10-28 DIAGNOSIS — Z79899 Other long term (current) drug therapy: Secondary | ICD-10-CM | POA: Insufficient documentation

## 2017-10-28 LAB — URINALYSIS, ROUTINE W REFLEX MICROSCOPIC
BILIRUBIN URINE: NEGATIVE
GLUCOSE, UA: NEGATIVE mg/dL
HGB URINE DIPSTICK: NEGATIVE
KETONES UR: NEGATIVE mg/dL
Leukocytes, UA: NEGATIVE
NITRITE: NEGATIVE
PH: 5 (ref 5.0–8.0)
Protein, ur: NEGATIVE mg/dL
Specific Gravity, Urine: 1.021 (ref 1.005–1.030)

## 2017-10-28 LAB — COMPREHENSIVE METABOLIC PANEL
ALBUMIN: 4.1 g/dL (ref 3.5–5.0)
ALT: 11 U/L — ABNORMAL LOW (ref 14–54)
ANION GAP: 9 (ref 5–15)
AST: 11 U/L — AB (ref 15–41)
Alkaline Phosphatase: 66 U/L (ref 38–126)
BILIRUBIN TOTAL: 0.5 mg/dL (ref 0.3–1.2)
BUN: 7 mg/dL (ref 6–20)
CHLORIDE: 105 mmol/L (ref 101–111)
CO2: 25 mmol/L (ref 22–32)
Calcium: 9.1 mg/dL (ref 8.9–10.3)
Creatinine, Ser: 0.67 mg/dL (ref 0.44–1.00)
GFR calc Af Amer: >60 mL/min (ref >60)
GFR calc non Af Amer: >60 mL/min (ref >60)
GLUCOSE: 115 mg/dL — AB (ref 65–99)
POTASSIUM: 4.1 mmol/L (ref 3.5–5.1)
SODIUM: 139 mmol/L (ref 135–145)
TOTAL PROTEIN: 7.3 g/dL (ref 6.5–8.1)

## 2017-10-28 LAB — LIPASE, BLOOD: Lipase: 20 U/L (ref 11–51)

## 2017-10-28 LAB — CBC
HEMATOCRIT: 39.4 % (ref 36.0–46.0)
HEMOGLOBIN: 12.3 g/dL (ref 12.0–15.0)
MCH: 25.3 pg — AB (ref 26.0–34.0)
MCHC: 31.2 g/dL (ref 30.0–36.0)
MCV: 81.1 fL (ref 78.0–100.0)
Platelets: 260 10*3/uL (ref 150–400)
RBC: 4.86 MIL/uL (ref 3.87–5.11)
RDW: 15 % (ref 11.5–15.5)
WBC: 9 10*3/uL (ref 4.0–10.5)

## 2017-10-28 LAB — PREGNANCY, URINE: Preg Test, Ur: NEGATIVE

## 2017-10-28 MED ORDER — DOCUSATE SODIUM 100 MG PO CAPS: 100.0000 mg | ORAL_CAPSULE | Freq: Two times a day (BID) | ORAL | 0 refills | Status: AC

## 2017-10-28 MED ORDER — IOHEXOL 300 MG/ML  SOLN
100.0000 mL | Freq: Once | INTRAMUSCULAR | Status: AC | PRN
  Administered 2017-10-28: 100 mL via INTRAVENOUS

## 2017-10-28 MED ORDER — POLYETHYLENE GLYCOL 3350 17 G PO PACK: 17.0000 g | PACK | Freq: Every day | ORAL | 0 refills | Status: AC | PRN

## 2017-10-28 MED ORDER — MORPHINE SULFATE (PF) 4 MG/ML IV SOLN
4.0000 mg | Freq: Once | INTRAVENOUS | Status: AC
  Administered 2017-10-28: 4 mg via INTRAVENOUS
  Filled 2017-10-28: qty 1

## 2017-10-28 NOTE — ED Provider Notes (Signed)
[facility] EMERGENCY DEPARTMENT Provider Note   CSN: [id number] Arrival date & time: 10/28/17  1339     History   Chief Complaint Chief Complaint  Patient presents with  . Abdominal Pain    HPI Kimberly Pace is a 42 y.o. female with history of fibromyalgia, hypertension, anemia who presents with a 4-day history of right periumbilical pain.  Patient describes her pain is intermittent, sharp, and burning.  It is worse when she moves or coughs.  She denies any fevers.  She has had some associated nausea.  She has had some radiation of pain around to her back.  She denies any urinary symptoms, diarrhea, bloody stools.  Patient reports she has felt a similar pain in the past, however is never persisted this long.  She reports recent pregnancy 16-17 months ago.  She now has a tubal ligation.  She denies any current chest pain, shortness of breath, urinary symptoms, abnormal vaginal bleeding or discharge, concern for STD exposure.  HPI  Past Medical History:  Diagnosis Date  . Anemia    history with pregnancy  . Anxiety    no meds since pregnancy  . Back pain   . Degenerative lumbar disc   . Fibromyalgia   . Headache(784.0)    otc med prn  . Hypertension   . Joint pain     Patient Active Problem List   Diagnosis Date Noted  . Postoperative state 04/06/2016  . Poor weight gain of pregnancy 02/05/2016  . Previous cesarean section complicating pregnancy, antepartum condition or complication 24/40/1027  . Antepartum multigravida of advanced maternal age 75/19/2017  . Drug abuse and dependence ([place]) 03/29/2014  . History of fetal anomaly in prior pregnancy, currently pregnant 03/29/2014  . Smoker 03/08/2014  . Obesity 03/08/2014  . Prior ectopic pregnancy, antepartum 06/16/2013    Past Surgical History:  Procedure Laterality Date  . BACK SURGERY    . CESAREAN SECTION     x 3  . CESAREAN SECTION WITH BILATERAL TUBAL LIGATION Bilateral 04/06/2016   Procedure: REPEAT CESAREAN SECTION WITH BILATERAL TUBAL LIGATION;  Surgeon: Lavonia Drafts, MD;  Location: [place] [facility];  Service: Obstetrics;  Laterality: Bilateral;  . DILATION AND CURETTAGE OF UTERUS    . DILATION AND EVACUATION N/A 05/06/2014   Procedure: DILATATION AND EVACUATION;  Surgeon: Donnamae Jude, MD;  Location: [facility];  Service: Gynecology;  Laterality: N/A;  . MULTIPLE TOOTH EXTRACTIONS     upper dneures and lower partial  . WISDOM TOOTH EXTRACTION       OB History    Gravida  6   Para  4   Term  4   Preterm  0   AB  2   Living  3     SAB  1   TAB  0   Ectopic  1   Multiple  0   Live Births  4        Obstetric Comments  Last pregnancy no heartbeat at [facility] Medications    Prior to Admission medications   Medication Sig Start Date End Date Taking? Authorizing Provider  Buprenorphine HCl-Naloxone HCl (SUBOXONE) 8-2 MG FILM Place 1 Film under the tongue daily.    [provider]  docusate sodium (COLACE) 100 MG capsule Take 1 capsule (100 mg total) by mouth 2 (two) times daily. 10/28/17   Coti Burd, Bea Graff, PA-C  HYDROmorphone (DILAUDID) 4 MG tablet Take 1 tablet (  4 mg total) by mouth every 4 (four) hours as needed for severe pain. 04/09/16   Steve Rattler, DO  ibuprofen (ADVIL,MOTRIN) 600 MG tablet Take 1 tablet (600 mg total) by mouth every 6 (six) hours. 04/09/16   Steve Rattler, DO  polyethylene glycol (MIRALAX / GLYCOLAX) packet Take 17 g by mouth daily as needed for mild constipation. 10/28/17   Genetta Fiero, Bea Graff, PA-C  Prenatal Vit-Fe Fumarate-FA (GOODSENSE PRENATAL VITAMINS) 28-0.8 MG TABS Take 1 tablet by mouth at bedtime. 08/31/15   Harris, Abigail, PA-C  senna-docusate (SENOKOT-S) 8.6-50 MG tablet Take 2 tablets by mouth daily. 04/10/16   Steve Rattler, DO    Family History Family History  Problem Relation Age of Onset  . Polycystic kidney disease Son        infantile, 2001  . Asthma Mother   .  Hypertension Mother   . Diabetes Mother   . Anesthesia problems Neg Hx     Social History Social History   Tobacco Use  . Smoking status: Light Tobacco Smoker    Packs/day: 0.25    Years: 15.00    Pack years: 3.75    Types: Cigarettes  . Smokeless tobacco: Never Used  Substance Use Topics  . Alcohol use: No    Comment: socially but none with pregnancy.    . Drug use: No    Comment: suboxone     Allergies   Patient has no known allergies.   Review of Systems Review of Systems  Constitutional: Negative for chills and fever.  HENT: Negative for facial swelling and sore throat.   Respiratory: Negative for shortness of breath.   Cardiovascular: Negative for chest pain.  Gastrointestinal: Positive for abdominal pain and nausea. Negative for blood in stool, diarrhea and vomiting.  Genitourinary: Negative for dysuria.  Musculoskeletal: Positive for back pain (chronic, some radiation of abdominal pain).  Skin: Negative for rash and wound.  Neurological: Negative for headaches.  Psychiatric/Behavioral: The patient is not nervous/anxious.      Physical Exam Updated Vital Signs BP 135/86 (BP Location: Right Arm)   Pulse 66   Temp 97.9 F (36.6 C) (Oral)   Resp 18   SpO2 100%   Physical Exam  Constitutional: She appears well-developed and well-nourished. No distress.  HENT:  Head: Normocephalic and atraumatic.  Mouth/Throat: Oropharynx is clear and moist. No oropharyngeal exudate.  Eyes: Pupils are equal, round, and reactive to light. Conjunctivae are normal. Right eye exhibits no discharge. Left eye exhibits no discharge. No scleral icterus.  Neck: Normal range of motion. Neck supple. No thyromegaly present.  Cardiovascular: Normal rate, regular rhythm, normal heart sounds and intact distal pulses. Exam reveals no gallop and no friction rub.  No murmur heard. Pulmonary/Chest: Effort normal and breath sounds normal. No stridor. No respiratory distress. She has no  wheezes. She has no rales.  Abdominal: Soft. Bowel sounds are normal. She exhibits no distension. There is tenderness. There is no rebound, no guarding, no CVA tenderness and negative Murphy's sign.    Musculoskeletal: She exhibits no edema.  Lymphadenopathy:    She has no cervical adenopathy.  Neurological: She is alert. Coordination normal.  Skin: Skin is warm and dry. No rash noted. She is not diaphoretic. No pallor.  Psychiatric: She has a normal mood and affect.  Nursing note and vitals reviewed.    ED Treatments / Results  Labs (all labs ordered are listed, but only abnormal results are displayed) Labs Reviewed  COMPREHENSIVE METABOLIC PANEL -  Abnormal; Notable for the following components:      Result Value   Glucose, Bld 115 (*)    AST 11 (*)    ALT 11 (*)    All other components within normal limits  CBC - Abnormal; Notable for the following components:   MCH 25.3 (*)    All other components within normal limits  LIPASE, BLOOD  URINALYSIS, ROUTINE W REFLEX MICROSCOPIC  PREGNANCY, URINE    EKG None  Radiology Ct Abdomen Pelvis W Contrast  Result Date: 10/28/2017 CLINICAL DATA:  Abdominal pain and nausea for 3 days. Appendicitis suspected. EXAM: CT ABDOMEN AND PELVIS WITH CONTRAST TECHNIQUE: Multidetector CT imaging of the abdomen and pelvis was performed using the standard protocol following bolus administration of intravenous contrast. CONTRAST:  131mL OMNIPAQUE IOHEXOL 300 MG/ML  SOLN COMPARISON:  None. FINDINGS: Lower chest: The lung bases are clear. Hepatobiliary: Prominent liver spanning 21 cm cranial caudal. No focal lesion. Gallbladder physiologically distended, no calcified stone. No biliary dilatation. Pancreas: No ductal dilatation or inflammation. Spleen: Upper normal in size spanning 13.7 cm. No focal abnormality. Adrenals/Urinary Tract: Normal adrenal glands. No hydronephrosis or perinephric edema. Homogeneous renal enhancement with symmetric excretion on  delayed phase imaging. Urinary bladder is non distended without wall thickening. Stomach/Bowel: Stomach is within normal limits. Appendix appears normal. No evidence of bowel wall thickening, distention, or inflammatory changes. Moderate stool in the proximal colon, distal colon is decompressed. Vascular/Lymphatic: Mild aortic atherosclerosis. No aneurysm. No enlarged abdominal or pelvic lymph nodes. Reproductive: Uterus and bilateral adnexa are unremarkable. Bilateral tubal ligation clips. Other: No free air, free fluid, or intra-abdominal fluid collection. Musculoskeletal: Posterior L5-S1 fusion with interbody spacer. There are no acute or suspicious osseous abnormalities. IMPRESSION: 1. No acute abnormality.  Particularly, normal appendix. 2. Aortic Atherosclerosis (ICD10-I70.0), mild, but advanced for age. Electronically Signed   By: Jeb Levering M.D.   On: 10/28/2017 21:29    Procedures Procedures (including critical care time)  Medications Ordered in ED Medications  morphine 4 MG/ML injection 4 mg (4 mg Intravenous Given 10/28/17 2033)  iohexol (OMNIPAQUE) 300 MG/ML solution 100 mL (100 mLs Intravenous Contrast Given 10/28/17 2104)     Initial Impression / Assessment and Plan / ED Course  I have reviewed the triage vital signs and the nursing notes.  Pertinent labs & imaging results that were available during my care of the patient were reviewed by me and considered in my medical decision making (see chart for details).     Patient with intermittent abdominal pain, worse with eating.  CBC, CMP, lipase unremarkable.  CT abdomen pelvis is negative for acute findings, but does show moderate stool burden in the proximal colon.  Patient does seem to be tender over this area.  Considering patient has had this pain in the past, constipation seems to be a possible diagnosis.  Will discharge home with MiraLAX and Colace.  Will refer to GI.  Return precautions discussed.  Patient understands and  agrees with plan.  Patient vitals stable throughout ED course and discharged in satisfactory condition peer  Final Clinical Impressions(s) / ED Diagnoses   Final diagnoses:  Periumbilical abdominal pain  Constipation, unspecified constipation type    ED Discharge Orders        Ordered    docusate sodium (COLACE) 100 MG capsule  2 times daily     10/28/17 2140    polyethylene glycol (MIRALAX / GLYCOLAX) packet  Daily PRN     10/28/17 2140  Frederica Kuster, PA-C 10/28/17 2157    Davonna Belling, MD 10/28/17 2158

## 2017-10-28 NOTE — ED Notes (Signed)
This RN administered 4mg  morphine to pt as ordered, Pt stated "morphine doesn't usually work for me, they usually give me...ummm...dilaudid. But that was a long time ago.Marland KitchenMarland Kitchen"

## 2017-10-28 NOTE — Discharge Instructions (Signed)
Take MiraLAX up to 4 times in a row to help reduce regular bowel movements.  Take Colace twice daily to help soften your stools.  Please follow-up with gastroenterology for further evaluation and treatment of your symptoms if they are not improving over the next few days.  Please return to the emergency department if you develop any new or worsening symptoms.

## 2019-07-21 ENCOUNTER — Emergency Department (HOSPITAL_COMMUNITY)
Admission: EM | Admit: 2019-07-21 | Discharge: 2019-07-21 | Disposition: A | Discharge status: Home / Self Care | Payer: Self-pay | Attending: Emergency Medicine | Admitting: Emergency Medicine

## 2019-07-21 ENCOUNTER — Other Ambulatory Visit: Payer: Self-pay

## 2019-07-21 ENCOUNTER — Encounter (HOSPITAL_COMMUNITY): Payer: Self-pay | Admitting: Emergency Medicine

## 2019-07-21 DIAGNOSIS — F17210 Nicotine dependence, cigarettes, uncomplicated: Secondary | ICD-10-CM | POA: Insufficient documentation

## 2019-07-21 DIAGNOSIS — Z79899 Other long term (current) drug therapy: Secondary | ICD-10-CM | POA: Insufficient documentation

## 2019-07-21 DIAGNOSIS — R350 Frequency of micturition: Secondary | ICD-10-CM | POA: Insufficient documentation

## 2019-07-21 DIAGNOSIS — N912 Amenorrhea, unspecified: Secondary | ICD-10-CM | POA: Insufficient documentation

## 2019-07-21 DIAGNOSIS — N644 Mastodynia: Secondary | ICD-10-CM | POA: Insufficient documentation

## 2019-07-21 DIAGNOSIS — R110 Nausea: Secondary | ICD-10-CM | POA: Insufficient documentation

## 2019-07-21 DIAGNOSIS — I10 Essential (primary) hypertension: Secondary | ICD-10-CM | POA: Insufficient documentation

## 2019-07-21 LAB — POC URINE PREG, ED: Preg Test, Ur: NEGATIVE

## 2019-07-21 LAB — URINALYSIS, ROUTINE W REFLEX MICROSCOPIC
Bilirubin Urine: NEGATIVE
Glucose, UA: NEGATIVE mg/dL
Hgb urine dipstick: NEGATIVE
Ketones, ur: NEGATIVE mg/dL
Leukocytes,Ua: NEGATIVE
Nitrite: NEGATIVE
Protein, ur: NEGATIVE mg/dL
Specific Gravity, Urine: 1.01 (ref 1.005–1.030)
pH: 6 (ref 5.0–8.0)

## 2019-07-21 LAB — I-STAT BETA HCG BLOOD, ED (MC, WL, AP ONLY): I-stat hCG, quantitative: <5 m[IU]/mL (ref <5)

## 2019-07-21 NOTE — Discharge Instructions (Addendum)
Your pregnancy test today is negative both by blood work and urine testing.  Your urine also appears normal does not show evidence of infection today.  Recommend follow-up with the women's clinic for further evaluation.

## 2019-07-21 NOTE — ED Notes (Signed)
Sent new urine sample to main lab

## 2019-07-21 NOTE — ED Provider Notes (Signed)
Kimberly Pace   CSN: [id number] Arrival date & time: 07/21/19  1231     History Chief Complaint  Patient presents with  . Missed period    Kimberly Pace is a 44 y.o. female.  44 year old female presents with complaint of missed period with concern for pregnancy.  Patient states last menstrual cycle she can recall was in December, reports 6 prior pregnancies and states that she is having similar symptoms including breast tenderness, urinary frequency and nausea.  Patient denies abdominal pain, dysuria or changes in bowel habits.  Patient reports having a tubal ligation 3 years ago, does not have a gynecologist currently.  No other complaints or concerns.        Past Medical History:  Diagnosis Date  . Anemia    history with pregnancy  . Anxiety    no meds since pregnancy  . Back pain   . Degenerative lumbar disc   . Fibromyalgia   . Headache(784.0)    otc med prn  . Hypertension   . Joint pain     Patient Active Problem List   Diagnosis Date Noted  . Postoperative state 04/06/2016  . Poor weight gain of pregnancy 02/05/2016  . Previous cesarean section complicating pregnancy, antepartum condition or complication [id number]  . Antepartum multigravida of advanced maternal age 56/19/2017  . Drug abuse and dependence ([place]) 03/29/2014  . History of fetal anomaly in prior pregnancy, currently pregnant 03/29/2014  . Smoker 03/08/2014  . Obesity 03/08/2014  . Prior ectopic pregnancy, antepartum 06/16/2013    Past Surgical History:  Procedure Laterality Date  . BACK SURGERY    . CESAREAN SECTION     x 3  . CESAREAN SECTION WITH BILATERAL TUBAL LIGATION Bilateral 04/06/2016   Procedure: REPEAT CESAREAN SECTION WITH BILATERAL TUBAL LIGATION;  Surgeon: Lavonia Drafts, MD;  Location: [facility];  Service: Obstetrics;  Laterality: Bilateral;  . DILATION AND CURETTAGE OF UTERUS    . DILATION AND  EVACUATION N/A 05/06/2014   Procedure: DILATATION AND EVACUATION;  Surgeon: Donnamae Jude, MD;  Location: [facility];  Service: Gynecology;  Laterality: N/A;  . MULTIPLE TOOTH EXTRACTIONS     upper dneures and lower partial  . WISDOM TOOTH EXTRACTION       OB History    Gravida  6   Para  4   Term  4   Preterm  0   AB  2   Living  3     SAB  1   TAB  0   Ectopic  1   Multiple  0   Live Births  4        Obstetric Comments  Last pregnancy no heartbeat at 9weeks        Family History  Problem Relation Age of Onset  . Polycystic kidney disease Son        infantile, 2001  . Asthma Mother   . Hypertension Mother   . Diabetes Mother   . Anesthesia problems Neg Hx     Social History   Tobacco Use  . Smoking status: Light Tobacco Smoker    Packs/day: 0.25    Years: 15.00    Pack years: 3.75    Types: Cigarettes  . Smokeless tobacco: Never Used  Substance Use Topics  . Alcohol use: No    Comment: socially but none with pregnancy.    . Drug use: No    Comment: [place]  Medications Prior to Admission medications   Medication Sig Start Date End Date Taking? Authorizing Provider  Buprenorphine HCl-Naloxone HCl (SUBOXONE) 8-2 MG FILM Place 1 Film under the tongue daily.    [provider]  docusate sodium (COLACE) 100 MG capsule Take 1 capsule (100 mg total) by mouth 2 (two) times daily. 10/28/17   Law, Bea Graff, PA-C  HYDROmorphone (DILAUDID) 4 MG tablet Take 1 tablet (4 mg total) by mouth every 4 (four) hours as needed for severe pain. 04/09/16   Steve Rattler, DO  ibuprofen (ADVIL,MOTRIN) 600 MG tablet Take 1 tablet (600 mg total) by mouth every 6 (six) hours. 04/09/16   Steve Rattler, DO  polyethylene glycol (MIRALAX / GLYCOLAX) packet Take 17 g by mouth daily as needed for mild constipation. 10/28/17   Law, Bea Graff, PA-C  Prenatal Vit-Fe Fumarate-FA (GOODSENSE PRENATAL VITAMINS) 28-0.8 MG TABS Take 1 tablet by mouth at bedtime.  08/31/15   Harris, Abigail, PA-C  senna-docusate (SENOKOT-S) 8.6-50 MG tablet Take 2 tablets by mouth daily. 04/10/16   Steve Rattler, DO    Allergies    Patient has no known allergies.  Review of Systems   Review of Systems  Constitutional: Negative for fever.  Gastrointestinal: Positive for nausea. Negative for abdominal pain, constipation, diarrhea and vomiting.  Genitourinary: Positive for frequency and menstrual problem. Negative for dysuria, vaginal bleeding and vaginal discharge.  Musculoskeletal: Negative for back pain and myalgias.  Skin: Negative for rash.  Allergic/Immunologic: Negative for immunocompromised state.  Neurological: Negative for weakness.  All other systems reviewed and are negative.   Physical Exam Updated Vital Signs BP 139/84 (BP Location: Right Arm)   Pulse 84   Temp 98.6 F (37 C) (Oral)   Resp 17   LMP 05/17/2019   SpO2 100%   Physical Exam Vitals and nursing Pace reviewed.  Constitutional:      General: She is not in acute distress.    Appearance: She is well-developed. She is not diaphoretic.  HENT:     Head: Normocephalic and atraumatic.  Cardiovascular:     Rate and Rhythm: Normal rate and regular rhythm.     Pulses: Normal pulses.     Heart sounds: Normal heart sounds.  Pulmonary:     Effort: Pulmonary effort is normal.     Breath sounds: Normal breath sounds.  Abdominal:     Palpations: Abdomen is soft.     Tenderness: There is no abdominal tenderness. There is no right CVA tenderness or left CVA tenderness.  Skin:    General: Skin is warm and dry.     Findings: No erythema or rash.  Neurological:     Mental Status: She is alert and oriented to person, place, and time.  Psychiatric:        Behavior: Behavior normal.     ED Results / Procedures / Treatments   Labs (all labs ordered are listed, but only abnormal results are displayed) Labs Reviewed  URINALYSIS, ROUTINE W REFLEX MICROSCOPIC - Abnormal; Notable for the  following components:      Result Value   Color, Urine AMBER (*)    APPearance HAZY (*)    All other components within normal limits  POC URINE PREG, ED  I-STAT BETA HCG BLOOD, ED ([facility], [facility], [facility] ONLY)    EKG None  Radiology No results found.  Procedures Procedures (including critical care time)  Medications Ordered in ED Medications - No data to display  ED Course  I  have reviewed the triage vital signs and the nursing notes.  Pertinent labs & imaging results that were available during my care of the patient were reviewed by me and considered in my medical decision making (see chart for details).  Clinical Course as of Jul 21 1627  Thu Jul 21, 6763  5170 44 year old female with complaint of missed menstrual period.  Reports urinary frequency and breast soreness as well as nausea, denies abdominal pain.  On exam, abdomen is soft and nontender.  Urine hCG as well as beta hCG are both negative, urinalysis is unremarkable.  Patient referred to [facility] for further gynecology work-up.   [LM]    Clinical Course User Index [LM] Roque Lias   MDM Rules/Calculators/A&P                      Final Clinical Impression(s) / ED Diagnoses Final diagnoses:  Amenorrhea    Rx / DC Orders ED Discharge Orders    None       Tacy Learn, PA-C 07/21/19 1629    Tegeler, Gwenyth Allegra, MD 07/21/19 [phone]

## 2019-07-21 NOTE — ED Notes (Signed)
Patient given discharge instructions patient verbalizes understanding. 

## 2019-07-21 NOTE — ED Triage Notes (Signed)
Pt thinks she may be pregnant. Had tubal ligation 3+ yrs ago, and got negative preg test 1 wk ago, but period is 1 mo late. Denies any abdominal cramping or discharge.

## 2019-07-21 NOTE — ED Notes (Signed)
Pt given urine cup for preg POC
